# Patient Record
Sex: Female | Born: 1990
Health system: Southern US, Community
[De-identification: ages and names within clinical notes are randomized; demographics above are authoritative.]

## PROBLEM LIST (undated history)

## (undated) DIAGNOSIS — E7800 Pure hypercholesterolemia, unspecified: Secondary | ICD-10-CM

## (undated) DIAGNOSIS — E282 Polycystic ovarian syndrome: Secondary | ICD-10-CM

## (undated) DIAGNOSIS — R51 Headache: Secondary | ICD-10-CM

## (undated) DIAGNOSIS — R519 Headache, unspecified: Secondary | ICD-10-CM

## (undated) DIAGNOSIS — B009 Herpesviral infection, unspecified: Secondary | ICD-10-CM

## (undated) DIAGNOSIS — F419 Anxiety disorder, unspecified: Secondary | ICD-10-CM

## (undated) DIAGNOSIS — E559 Vitamin D deficiency, unspecified: Secondary | ICD-10-CM

## (undated) DIAGNOSIS — N390 Urinary tract infection, site not specified: Secondary | ICD-10-CM

## (undated) DIAGNOSIS — U071 COVID-19: Secondary | ICD-10-CM

## (undated) DIAGNOSIS — E782 Mixed hyperlipidemia: Secondary | ICD-10-CM

## (undated) DIAGNOSIS — B019 Varicella without complication: Secondary | ICD-10-CM

## (undated) DIAGNOSIS — K589 Irritable bowel syndrome without diarrhea: Secondary | ICD-10-CM

## (undated) DIAGNOSIS — G8929 Other chronic pain: Secondary | ICD-10-CM

## (undated) HISTORY — PX: WISDOM TOOTH EXTRACTION: SHX21

## (undated) HISTORY — PX: TONSILLECTOMY: SUR1361

## (undated) HISTORY — DX: Headache, unspecified: R51.9

## (undated) HISTORY — DX: Anxiety disorder, unspecified: F41.9

## (undated) HISTORY — DX: Urinary tract infection, site not specified: N39.0

## (undated) HISTORY — DX: Other chronic pain: G89.29

## (undated) HISTORY — DX: Headache: R51

## (undated) HISTORY — DX: Varicella without complication: B01.9

## (undated) HISTORY — DX: COVID-19: U07.1

## (undated) HISTORY — DX: Herpesviral infection, unspecified: B00.9

## (undated) HISTORY — PX: OTHER SURGICAL HISTORY: SHX169

## (undated) HISTORY — DX: Irritable bowel syndrome, unspecified: K58.9

## (undated) HISTORY — DX: Hemochromatosis, unspecified: E83.119

---
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
## 2014-05-26 LAB — HM PAP SMEAR: HM Pap smear: NORMAL

## 2017-06-03 ENCOUNTER — Ambulatory Visit: Payer: BLUE CROSS/BLUE SHIELD | Admitting: Internal Medicine

## 2017-06-03 ENCOUNTER — Encounter: Payer: Self-pay | Admitting: Internal Medicine

## 2017-06-03 VITALS — BP 114/78 | HR 93 | Temp 98.8°F | Resp 16 | Ht 64.75 in | Wt 169.4 lb

## 2017-06-03 DIAGNOSIS — N926 Irregular menstruation, unspecified: Secondary | ICD-10-CM

## 2017-06-03 DIAGNOSIS — Z1159 Encounter for screening for other viral diseases: Secondary | ICD-10-CM | POA: Diagnosis not present

## 2017-06-03 DIAGNOSIS — Z148 Genetic carrier of other disease: Secondary | ICD-10-CM | POA: Diagnosis not present

## 2017-06-03 DIAGNOSIS — R1030 Lower abdominal pain, unspecified: Secondary | ICD-10-CM

## 2017-06-03 DIAGNOSIS — K582 Mixed irritable bowel syndrome: Secondary | ICD-10-CM | POA: Diagnosis not present

## 2017-06-03 DIAGNOSIS — Z13220 Encounter for screening for lipoid disorders: Secondary | ICD-10-CM | POA: Diagnosis not present

## 2017-06-03 DIAGNOSIS — K5900 Constipation, unspecified: Secondary | ICD-10-CM | POA: Diagnosis not present

## 2017-06-03 DIAGNOSIS — F419 Anxiety disorder, unspecified: Secondary | ICD-10-CM

## 2017-06-03 DIAGNOSIS — G4700 Insomnia, unspecified: Secondary | ICD-10-CM

## 2017-06-03 DIAGNOSIS — Z1329 Encounter for screening for other suspected endocrine disorder: Secondary | ICD-10-CM

## 2017-06-03 DIAGNOSIS — M25559 Pain in unspecified hip: Secondary | ICD-10-CM | POA: Diagnosis not present

## 2017-06-03 LAB — URINALYSIS, ROUTINE W REFLEX MICROSCOPIC
Bilirubin Urine: NEGATIVE
Ketones, ur: NEGATIVE
Leukocytes, UA: NEGATIVE
Nitrite: NEGATIVE
Specific Gravity, Urine: >=1.03 — AB (ref 1.000–1.030)
Total Protein, Urine: NEGATIVE
Urine Glucose: NEGATIVE
Urobilinogen, UA: 0.2 (ref 0.0–1.0)
pH: 6 (ref 5.0–8.0)

## 2017-06-03 LAB — LIPID PANEL
Cholesterol: 193 mg/dL (ref 0–200)
HDL: 52.3 mg/dL (ref >39.00)
LDL Cholesterol: 110 mg/dL — ABNORMAL HIGH (ref 0–99)
NonHDL: 140.81
Total CHOL/HDL Ratio: 4
Triglycerides: 152 mg/dL — ABNORMAL HIGH (ref 0.0–149.0)
VLDL: 30.4 mg/dL (ref 0.0–40.0)

## 2017-06-03 LAB — CBC WITH DIFFERENTIAL/PLATELET
Basophils Absolute: 0.1 10*3/uL (ref 0.0–0.1)
Basophils Relative: 0.9 % (ref 0.0–3.0)
Eosinophils Absolute: 0.2 10*3/uL (ref 0.0–0.7)
Eosinophils Relative: 2.1 % (ref 0.0–5.0)
HCT: 38.3 % (ref 36.0–46.0)
Hemoglobin: 13.1 g/dL (ref 12.0–15.0)
Lymphocytes Relative: 27.7 % (ref 12.0–46.0)
Lymphs Abs: 2.3 10*3/uL (ref 0.7–4.0)
MCHC: 34.2 g/dL (ref 30.0–36.0)
MCV: 89.1 fl (ref 78.0–100.0)
Monocytes Absolute: 0.4 10*3/uL (ref 0.1–1.0)
Monocytes Relative: 4.9 % (ref 3.0–12.0)
Neutro Abs: 5.2 10*3/uL (ref 1.4–7.7)
Neutrophils Relative %: 64.4 % (ref 43.0–77.0)
Platelets: 324 10*3/uL (ref 150.0–400.0)
RBC: 4.29 Mil/uL (ref 3.87–5.11)
RDW: 12.2 % (ref 11.5–15.5)
WBC: 8.1 10*3/uL (ref 4.0–10.5)

## 2017-06-03 LAB — COMPREHENSIVE METABOLIC PANEL
ALT: 10 U/L (ref 0–35)
AST: 12 U/L (ref 0–37)
BUN: 8 mg/dL (ref 6–23)
CO2: 25 mEq/L (ref 19–32)
Calcium: 8.9 mg/dL (ref 8.4–10.5)
Chloride: 105 mEq/L (ref 96–112)
Creatinine, Ser: 0.72 mg/dL (ref 0.40–1.20)
Glucose, Bld: 97 mg/dL (ref 70–99)
Sodium: 138 mEq/L (ref 135–145)
Total Bilirubin: 0.5 mg/dL (ref 0.2–1.2)

## 2017-06-03 LAB — COMPREHENSIVE METABOLIC PANEL WITH GFR
Albumin: 3.7 g/dL (ref 3.5–5.2)
Alkaline Phosphatase: 48 U/L (ref 39–117)
GFR: 104.47 mL/min (ref >60.00)
Potassium: 3.7 meq/L (ref 3.5–5.1)
Total Protein: 6.7 g/dL (ref 6.0–8.3)

## 2017-06-03 LAB — TSH: TSH: 1.7 u[IU]/mL (ref 0.35–4.50)

## 2017-06-03 LAB — T4, FREE: Free T4: 0.79 ng/dL (ref 0.60–1.60)

## 2017-06-03 MED ORDER — DICYCLOMINE HCL 20 MG PO TABS: 20.0000 mg | ORAL_TABLET | Freq: Three times a day (TID) | ORAL | 0 refills | Status: DC

## 2017-06-03 MED ORDER — LORAZEPAM 0.5 MG PO TABS: 0.5000 mg | ORAL_TABLET | Freq: Every day | ORAL | 0 refills | Status: DC | PRN

## 2017-06-03 NOTE — Patient Instructions (Addendum)
Follow up in 1 month  Referred to GI  Read below  Take care  Try Bentyl for abdominal pain  Try Align probiotics from [employer] daily    Food Choices for Gastroesophageal Reflux Disease, Adult When you have gastroesophageal reflux disease (GERD), the foods you eat and your eating habits are very important. Choosing the right foods can help ease your discomfort. What guidelines do I need to follow?  Choose fruits, vegetables, whole grains, and low-fat dairy products.  Choose low-fat meat, fish, and poultry.  Limit fats such as oils, salad dressings, butter, nuts, and avocado.  Keep a food diary. This helps you identify foods that cause symptoms.  Avoid foods that cause symptoms. These may be different for everyone.  Eat small meals often instead of 3 large meals a day.  Eat your meals slowly, in a place where you are relaxed.  Limit fried foods.  Cook foods using methods other than frying.  Avoid drinking alcohol.  Avoid drinking large amounts of liquids with your meals.  Avoid bending over or lying down until 2-3 hours after eating. What foods are not recommended? These are some foods and drinks that may make your symptoms worse: Vegetables Tomatoes. Tomato juice. Tomato and spaghetti sauce. Chili peppers. Onion and garlic. Horseradish. Fruits Oranges, grapefruit, and lemon (fruit and juice). Meats High-fat meats, fish, and poultry. This includes hot dogs, ribs, ham, sausage, salami, and bacon. Dairy Whole milk and chocolate milk. Sour cream. Cream. Butter. Ice cream. Cream cheese. Drinks Coffee and tea. Bubbly (carbonated) drinks or energy drinks. Condiments Hot sauce. Barbecue sauce. Sweets/Desserts Chocolate and cocoa. Donuts. Peppermint and spearmint. Fats and Oils High-fat foods. This includes [place] fries and potato chips. Other Vinegar. Strong spices. This includes black pepper, white pepper, red pepper, cayenne, curry powder, cloves, ginger, and chili  powder. The items listed above may not be a complete list of foods and drinks to avoid. Contact your dietitian for more information. This information is not intended to replace advice given to you by your health care provider. Make sure you discuss any questions you have with your health care provider. Document Released: 10/26/2011 Document Revised: 10/02/2015 Document Reviewed: 02/28/2013 Elsevier Interactive Patient Education  2017 Elsevier Inc.   Gastroesophageal Reflux Disease, Adult Normally, food travels down the esophagus and stays in the stomach to be digested. If a person has gastroesophageal reflux disease (GERD), food and stomach acid move back up into the esophagus. When this happens, the esophagus becomes sore and swollen (inflamed). Over time, GERD can make small holes (ulcers) in the lining of the esophagus. Follow these instructions at home: Diet  Follow a diet as told by your doctor. You may need to avoid foods and drinks such as: ? Coffee and tea (with or without caffeine). ? Drinks that contain alcohol. ? Energy drinks and sports drinks. ? Carbonated drinks or sodas. ? Chocolate and cocoa. ? Peppermint and mint flavorings. ? Garlic and onions. ? Horseradish. ? Spicy and acidic foods, such as peppers, chili powder, curry powder, vinegar, hot sauces, and BBQ sauce. ? Citrus fruit juices and citrus fruits, such as oranges, lemons, and limes. ? Tomato-based foods, such as red sauce, chili, salsa, and pizza with red sauce. ? Fried and fatty foods, such as donuts, french fries, potato chips, and high-fat dressings. ? High-fat meats, such as hot dogs, rib eye steak, sausage, ham, and bacon. ? High-fat dairy items, such as whole milk, butter, and cream cheese.  Eat small meals often. Avoid eating  large meals.  Avoid drinking large amounts of liquid with your meals.  Avoid eating meals during the 2-3 hours before bedtime.  Avoid lying down right after you eat.  Do not  exercise right after you eat. General instructions  Pay attention to any changes in your symptoms.  Take over-the-counter and prescription medicines only as told by your doctor. Do not take aspirin, ibuprofen, or other NSAIDs unless your doctor says it is okay.  Do not use any tobacco products, including cigarettes, chewing tobacco, and e-cigarettes. If you need help quitting, ask your doctor.  Wear loose clothes. Do not wear anything tight around your waist.  Raise (elevate) the head of your bed about 6 inches (15 cm).  Try to lower your stress. If you need help doing this, ask your doctor.  If you are overweight, lose an amount of weight that is healthy for you. Ask your doctor about a safe weight loss goal.  Keep all follow-up visits as told by your doctor. This is important. Contact a doctor if:  You have new symptoms.  You lose weight and you do not know why it is happening.  You have trouble swallowing, or it hurts to swallow.  You have wheezing or a cough that keeps happening.  Your symptoms do not get better with treatment.  You have a hoarse voice. Get help right away if:  You have pain in your arms, neck, jaw, teeth, or back.  You feel sweaty, dizzy, or light-headed.  You have chest pain or shortness of breath.  You throw up (vomit) and your throw up looks like blood or coffee grounds.  You pass out (faint).  Your poop (stool) is bloody or black.  You cannot swallow, drink, or eat. This information is not intended to replace advice given to you by your health care provider. Make sure you discuss any questions you have with your health care provider. Document Released: 10/13/2007 Document Revised: 10/02/2015 Document Reviewed: 08/21/2014 Elsevier Interactive Patient Education  2018 Elsevier Inc.   Generalized Anxiety Disorder, Adult Generalized anxiety disorder (GAD) is a mental health disorder. People with this condition constantly worry about everyday  events. Unlike normal anxiety, worry related to GAD is not triggered by a specific event. These worries also do not fade or get better with time. GAD interferes with life functions, including relationships, work, and school. GAD can vary from mild to severe. People with severe GAD can have intense waves of anxiety with physical symptoms (panic attacks). What are the causes? The exact cause of GAD is not known. What increases the risk? This condition is more likely to develop in:  Women.  People who have a family history of anxiety disorders.  People who are very shy.  People who experience very stressful life events, such as the death of a loved one.  People who have a very stressful family environment.  What are the signs or symptoms? People with GAD often worry excessively about many things in their lives, such as their health and family. They may also be overly concerned about:  Doing well at work.  Being on time.  Natural disasters.  Friendships.  Physical symptoms of GAD include:  Fatigue.  Muscle tension or having muscle twitches.  Trembling or feeling shaky.  Being easily startled.  Feeling like your heart is pounding or racing.  Feeling out of breath or like you cannot take a deep breath.  Having trouble falling asleep or staying asleep.  Sweating.  Nausea, diarrhea,  or irritable bowel syndrome (IBS).  Headaches.  Trouble concentrating or remembering facts.  Restlessness.  Irritability.  How is this diagnosed? Your health care provider can diagnose GAD based on your symptoms and medical history. You will also have a physical exam. The health care provider will ask specific questions about your symptoms, including how severe they are, when they started, and if they come and go. Your health care provider may ask you about your use of alcohol or drugs, including prescription medicines. Your health care provider may refer you to a mental health specialist  for further evaluation. Your health care provider will do a thorough examination and may perform additional tests to rule out other possible causes of your symptoms. To be diagnosed with GAD, a person must have anxiety that:  Is out of his or her control.  Affects several different aspects of his or her life, such as work and relationships.  Causes distress that makes him or her unable to take part in normal activities.  Includes at least three physical symptoms of GAD, such as restlessness, fatigue, trouble concentrating, irritability, muscle tension, or sleep problems.  Before your health care provider can confirm a diagnosis of GAD, these symptoms must be present more days than they are not, and they must last for six months or longer. How is this treated? The following therapies are usually used to treat GAD:  Medicine. Antidepressant medicine is usually prescribed for long-term daily control. Antianxiety medicines may be added in severe cases, especially when panic attacks occur.  Talk therapy (psychotherapy). Certain types of talk therapy can be helpful in treating GAD by providing support, education, and guidance. Options include: ? Cognitive behavioral therapy (CBT). People learn coping skills and techniques to ease their anxiety. They learn to identify unrealistic or negative thoughts and behaviors and to replace them with positive ones. ? Acceptance and commitment therapy (ACT). This treatment teaches people how to be mindful as a way to cope with unwanted thoughts and feelings. ? Biofeedback. This process trains you to manage your body's response (physiological response) through breathing techniques and relaxation methods. You will work with a therapist while machines are used to monitor your physical symptoms.  Stress management techniques. These include yoga, meditation, and exercise.  A mental health specialist can help determine which treatment is best for you. Some people see  improvement with one type of therapy. However, other people require a combination of therapies. Follow these instructions at home:  Take over-the-counter and prescription medicines only as told by your health care provider.  Try to maintain a normal routine.  Try to anticipate stressful situations and allow extra time to manage them.  Practice any stress management or self-calming techniques as taught by your health care provider.  Do not punish yourself for setbacks or for not making progress.  Try to recognize your accomplishments, even if they are small.  Keep all follow-up visits as told by your health care provider. This is important. Contact a health care provider if:  Your symptoms do not get better.  Your symptoms get worse.  You have signs of depression, such as: ? A persistently sad, cranky, or irritable mood. ? Loss of enjoyment in activities that used to bring you joy. ? Change in weight or eating. ? Changes in sleeping habits. ? Avoiding friends or family members. ? Loss of energy for normal tasks. ? Feelings of guilt or worthlessness. Get help right away if:  You have serious thoughts about hurting yourself or  others. If you ever feel like you may hurt yourself or others, or have thoughts about taking your own life, get help right away. You can go to your nearest emergency department or call:  Your local emergency services (911 in the [place]  A suicide crisis helpline, such as the National Suicide Prevention Lifeline at 938-811-17381-681-688-2545. This is open 24 hours a day.  Summary  Generalized anxiety disorder (GAD) is a mental health disorder that involves worry that is not triggered by a specific event.  People with GAD often worry excessively about many things in their lives, such as their health and family.  GAD may cause physical symptoms such as restlessness, trouble concentrating, sleep problems, frequent sweating, nausea, diarrhea, headaches, and trembling  or muscle twitching.  A mental health specialist can help determine which treatment is best for you. Some people see improvement with one type of therapy. However, other people require a combination of therapies. This information is not intended to replace advice given to you by your health care provider. Make sure you discuss any questions you have with your health care provider. Document Released: 08/21/2012 Document Revised: 03/16/2016 Document Reviewed: 03/16/2016 Elsevier Interactive Patient Education  2018 Elsevier Inc.  Irritable Bowel Syndrome, Adult Irritable bowel syndrome (IBS) is not one specific disease. It is a group of symptoms that affects the organs responsible for digestion (gastrointestinal or GI tract). To regulate how your GI tract works, your body sends signals back and forth between your intestines and your brain. If you have IBS, there may be a problem with these signals. As a result, your GI tract does not function normally. Your intestines may become more sensitive and overreact to certain things. This is especially true when you eat certain foods or when you are under stress. There are four types of IBS. These may be determined based on the consistency of your stool:  IBS with diarrhea.  IBS with constipation.  Mixed IBS.  Unsubtyped IBS.  It is important to know which type of IBS you have. Some treatments are more likely to be helpful for certain types of IBS. What are the causes? The exact cause of IBS is not known. What increases the risk? You may have a higher risk of IBS if:  You are a woman.  You are younger than 26 years old.  You have a family history of IBS.  You have mental health problems.  You have had bacterial infection of your GI tract.  What are the signs or symptoms? Symptoms of IBS vary from person to person. The main symptom is abdominal pain or discomfort. Additional symptoms usually include one or more of the  following:  Diarrhea, constipation, or both.  Abdominal swelling or bloating.  Feeling full or sick after eating a small or regular-size meal.  Frequent gas.  Mucus in the stool.  A feeling of having more stool left after a bowel movement.  Symptoms tend to come and go. They may be associated with stress, psychiatric conditions, or nothing at all. How is this diagnosed? There is no specific test to diagnose IBS. Your health care provider will make a diagnosis based on a physical exam, medical history, and your symptoms. You may have other tests to rule out other conditions that may be causing your symptoms. These may include:  Blood tests.  X-rays.  CT scan.  Endoscopy and colonoscopy. This is a test in which your GI tract is viewed with a long, thin, flexible tube.  How is  this treated? There is no cure for IBS, but treatment can help relieve symptoms. IBS treatment often includes:  Changes to your diet, such as: ? Eating more fiber. ? Avoiding foods that cause symptoms. ? Drinking more water. ? Eating regular, medium-sized portioned meals.  Medicines. These may include: ? Fiber supplements if you have constipation. ? Medicine to control diarrhea (antidiarrheal medicines). ? Medicine to help control muscle spasms in your GI tract (antispasmodic medicines). ? Medicines to help with any mental health issues, such as antidepressants or tranquilizers.  Therapy. ? Talk therapy may help with anxiety, depression, or other mental health issues that can make IBS symptoms worse.  Stress reduction. ? Managing your stress can help keep symptoms under control.  Follow these instructions at home:  Take medicines only as directed by your health care provider.  Eat a healthy diet. ? Avoid foods and drinks with added sugar. ? Include more whole grains, fruits, and vegetables gradually into your diet. This may be especially helpful if you have IBS with constipation. ? Avoid any  foods and drinks that make your symptoms worse. These may include dairy products and caffeinated or carbonated drinks. ? Do not eat large meals. ? Drink enough fluid to keep your urine clear or pale yellow.  Exercise regularly. Ask your health care provider for recommendations of good activities for you.  Keep all follow-up visits as directed by your health care provider. This is important. Contact a health care provider if:  You have constant pain.  You have trouble or pain with swallowing.  You have worsening diarrhea. Get help right away if:  You have severe and worsening abdominal pain.  You have diarrhea and: ? You have a rash, stiff neck, or severe headache. ? You are irritable, sleepy, or difficult to awaken. ? You are weak, dizzy, or extremely thirsty.  You have bright red blood in your stool or you have black tarry stools.  You have unusual abdominal swelling that is painful.  You vomit continuously.  You vomit blood (hematemesis).  You have both abdominal pain and a fever. This information is not intended to replace advice given to you by your health care provider. Make sure you discuss any questions you have with your health care provider. Document Released: 04/26/2005 Document Revised: 09/26/2015 Document Reviewed: 01/11/2014 Elsevier Interactive Patient Education  2018 [employer].  Diet for Irritable Bowel Syndrome When you have irritable bowel syndrome (IBS), the foods you eat and your eating habits are very important. IBS may cause various symptoms, such as abdominal pain, constipation, or diarrhea. Choosing the right foods can help ease discomfort caused by these symptoms. Work with your health care provider and dietitian to find the best eating plan to help control your symptoms. What general guidelines do I need to follow?  Keep a food diary. This will help you identify foods that cause symptoms. Write down: ? What you eat and when. ? What symptoms you  have. ? When symptoms occur in relation to your meals.  Avoid foods that cause symptoms. Talk with your dietitian about other ways to get the same nutrients that are in these foods.  Eat more foods that contain fiber. Take a fiber supplement if directed by your dietitian.  Eat your meals slowly, in a relaxed setting.  Aim to eat 5-6 small meals per day. Do not skip meals.  Drink enough fluids to keep your urine clear or pale yellow.  Ask your health care provider if you should take  an over-the-counter probiotic during flare-ups to help restore healthy gut bacteria.  If you have cramping or diarrhea, try making your meals low in fat and high in carbohydrates. Examples of carbohydrates are pasta, rice, whole grain breads and cereals, fruits, and vegetables.  If dairy products cause your symptoms to flare up, try eating less of them. You might be able to handle yogurt better than other dairy products because it contains bacteria that help with digestion. What foods are not recommended? The following are some foods and drinks that may worsen your symptoms:  Fatty foods, such as [place] fries.  Milk products, such as cheese or ice cream.  Chocolate.  Alcohol.  Products with caffeine, such as coffee.  Carbonated drinks, such as soda.  The items listed above may not be a complete list of foods and beverages to avoid. Contact your dietitian for more information. What foods are good sources of fiber? Your health care provider or dietitian may recommend that you eat more foods that contain fiber. Fiber can help reduce constipation and other IBS symptoms. Add foods with fiber to your diet a little at a time so that your body can get used to them. Too much fiber at once might cause gas and swelling of your abdomen. The following are some foods that are good sources of fiber:  Apples.  Peaches.  Pears.  Berries.  Figs.  Broccoli (raw).  Cabbage.  Carrots.  Raw peas.  Kidney  beans.  Lima beans.  Whole grain bread.  Whole grain cereal.  Where to find more information: [employer] for Functional Gastrointestinal Disorders: www.iffgd.[employer] of Diabetes and Digestive and Kidney Diseases: http://norris-lawson.com/.aspx This information is not intended to replace advice given to you by your health care provider. Make sure you discuss any questions you have with your health care provider. Document Released: 07/17/2003 Document Revised: 10/02/2015 Document Reviewed: 07/27/2013 Elsevier Interactive Patient Education  2018 [employer].

## 2017-06-05 LAB — TRANSFERRIN SATURATION
IRON SATN MFR SERPL: 39 %{saturation}
IRON SERPL-MCNC: 186 ug/dL — ABNORMAL HIGH
TRANSFERRIN SERPL-MCNC: 340 mg/dL

## 2017-06-07 ENCOUNTER — Encounter: Payer: Self-pay | Admitting: Internal Medicine

## 2017-06-07 DIAGNOSIS — R1030 Lower abdominal pain, unspecified: Secondary | ICD-10-CM | POA: Insufficient documentation

## 2017-06-07 DIAGNOSIS — R197 Diarrhea, unspecified: Secondary | ICD-10-CM | POA: Insufficient documentation

## 2017-06-07 DIAGNOSIS — Z148 Genetic carrier of other disease: Secondary | ICD-10-CM | POA: Insufficient documentation

## 2017-06-07 DIAGNOSIS — N926 Irregular menstruation, unspecified: Secondary | ICD-10-CM | POA: Insufficient documentation

## 2017-06-07 DIAGNOSIS — G4700 Insomnia, unspecified: Secondary | ICD-10-CM | POA: Insufficient documentation

## 2017-06-07 DIAGNOSIS — K582 Mixed irritable bowel syndrome: Secondary | ICD-10-CM | POA: Insufficient documentation

## 2017-06-07 DIAGNOSIS — K5900 Constipation, unspecified: Secondary | ICD-10-CM | POA: Insufficient documentation

## 2017-06-07 DIAGNOSIS — F419 Anxiety disorder, unspecified: Secondary | ICD-10-CM | POA: Insufficient documentation
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
## 2017-06-09 LAB — TISSUE TRANSGLUTAMINASE, IGA: (tTG) Ab, IgA: 1 U/mL

## 2017-06-09 LAB — RETICULIN ANTIBODIES, IGA W TITER: Reticulin IGA Screen: NEGATIVE

## 2017-06-09 LAB — IRON,TIBC AND FERRITIN PANEL
%SAT: 47 % (calc) (ref 11–50)
Ferritin: 47 ng/mL (ref 10–154)
Iron: 190 ug/dL (ref 40–190)
TIBC: 406 ug/dL (ref 250–450)

## 2017-06-09 LAB — HEPATITIS B SURFACE ANTIBODY, QUANTITATIVE: Hep B S AB Quant (Post): <5 m[IU]/mL — ABNORMAL LOW (ref >=10)

## 2017-06-09 LAB — GLIADIN ANTIBODIES, SERUM
Gliadin IgA: 4 Units
Gliadin IgG: 2 U

## 2017-06-10 ENCOUNTER — Ambulatory Visit: Payer: BLUE CROSS/BLUE SHIELD | Admitting: Gastroenterology

## 2017-06-10 ENCOUNTER — Encounter: Payer: Self-pay | Admitting: Gastroenterology

## 2017-06-10 ENCOUNTER — Other Ambulatory Visit: Payer: Self-pay

## 2017-06-10 VITALS — BP 118/81 | HR 88 | Temp 98.1°F | Ht 63.0 in | Wt 168.8 lb

## 2017-06-10 DIAGNOSIS — K580 Irritable bowel syndrome with diarrhea: Secondary | ICD-10-CM | POA: Diagnosis not present

## 2017-06-10 DIAGNOSIS — G43909 Migraine, unspecified, not intractable, without status migrainosus: Secondary | ICD-10-CM | POA: Insufficient documentation

## 2017-06-10 MED ORDER — AMITRIPTYLINE HCL 25 MG PO TABS: 25.0000 mg | ORAL_TABLET | Freq: Every day | ORAL | 0 refills | Status: DC
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
## 2017-06-11 LAB — SEDIMENTATION RATE: Sed Rate: 5 mm/hr (ref 0–32)

## 2017-06-11 LAB — C-REACTIVE PROTEIN: CRP: 8 mg/L — ABNORMAL HIGH (ref 0.0–4.9)

## 2017-06-13 ENCOUNTER — Encounter: Payer: Self-pay | Admitting: Gastroenterology

## 2017-06-16 ENCOUNTER — Telehealth: Payer: Self-pay | Admitting: Internal Medicine

## 2017-06-16 NOTE — Telephone Encounter (Signed)
Labs have not been reviewed by provider.

## 2017-06-16 NOTE — Telephone Encounter (Signed)
Patient is calling to review her lab results. She states she did not get a call. Reviewed results with patient and she voices understanding. She will discuss stating her Hep B vaccine at her follow up appointment.

## 2017-06-20 LAB — GI PROFILE, STOOL, PCR

## 2017-06-20 LAB — C DIFFICILE, CYTOTOXIN B

## 2017-06-20 LAB — SPECIMEN STATUS REPORT

## 2017-06-20 LAB — CALPROTECTIN, FECAL: Calprotectin, Fecal: <16 ug/g (ref 0–120)

## 2017-06-20 LAB — C DIFFICILE TOXINS A+B W/RFLX: C difficile Toxins A+B, EIA: NEGATIVE

## 2017-06-21 ENCOUNTER — Other Ambulatory Visit: Payer: Self-pay

## 2017-06-21 DIAGNOSIS — R197 Diarrhea, unspecified: Secondary | ICD-10-CM

## 2017-06-21 MED ORDER — NA SULFATE-K SULFATE-MG SULF 17.5-3.13-1.6 GM/177ML PO SOLN: ORAL | 0 refills | Status: DC

## 2017-07-01 ENCOUNTER — Encounter: Payer: Self-pay | Admitting: Internal Medicine

## 2017-07-01 ENCOUNTER — Other Ambulatory Visit: Payer: Self-pay | Admitting: Internal Medicine

## 2017-07-01 ENCOUNTER — Ambulatory Visit: Payer: BLUE CROSS/BLUE SHIELD | Admitting: Internal Medicine

## 2017-07-01 VITALS — BP 110/60 | HR 100 | Temp 99.0°F | Ht 63.0 in | Wt 174.8 lb

## 2017-07-01 DIAGNOSIS — F419 Anxiety disorder, unspecified: Secondary | ICD-10-CM

## 2017-07-01 DIAGNOSIS — N888 Other specified noninflammatory disorders of cervix uteri: Secondary | ICD-10-CM | POA: Diagnosis not present

## 2017-07-01 DIAGNOSIS — K582 Mixed irritable bowel syndrome: Secondary | ICD-10-CM | POA: Diagnosis not present

## 2017-07-01 DIAGNOSIS — N941 Unspecified dyspareunia: Secondary | ICD-10-CM | POA: Diagnosis not present

## 2017-07-01 DIAGNOSIS — R197 Diarrhea, unspecified: Secondary | ICD-10-CM | POA: Diagnosis not present

## 2017-07-01 NOTE — Progress Notes (Signed)
Pre visit review using our clinic review tool, if applicable. No additional management support is needed unless otherwise documented below in the visit note. 
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
## 2017-07-01 NOTE — Patient Instructions (Signed)
Asked GI about reducing dose amitriptyline to 10 Ask about allergy testing  Ask about other alternatives to IBguard  Come back for nurse visit for hepatitis B vaccines at 0, 2 and 6 months  F/u in 4 months  [facility] and make f/u appt with Dr. Dalbert GarnetBeasley  Take care   Irritable Bowel Syndrome, Adult Irritable bowel syndrome (IBS) is not one specific disease. It is a group of symptoms that affects the organs responsible for digestion (gastrointestinal or GI tract). To regulate how your GI tract works, your body sends signals back and forth between your intestines and your brain. If you have IBS, there may be a problem with these signals. As a result, your GI tract does not function normally. Your intestines may become more sensitive and overreact to certain things. This is especially true when you eat certain foods or when you are under stress. There are four types of IBS. These may be determined based on the consistency of your stool:  IBS with diarrhea.  IBS with constipation.  Mixed IBS.  Unsubtyped IBS.  It is important to know which type of IBS you have. Some treatments are more likely to be helpful for certain types of IBS. What are the causes? The exact cause of IBS is not known. What increases the risk? You may have a higher risk of IBS if:  You are a woman.  You are younger than 26 years old.  You have a family history of IBS.  You have mental health problems.  You have had bacterial infection of your GI tract.  What are the signs or symptoms? Symptoms of IBS vary from person to person. The main symptom is abdominal pain or discomfort. Additional symptoms usually include one or more of the following:  Diarrhea, constipation, or both.  Abdominal swelling or bloating.  Feeling full or sick after eating a small or regular-size meal.  Frequent gas.  Mucus in the stool.  A feeling of having more stool left after a bowel movement.  Symptoms tend to come and go.  They may be associated with stress, psychiatric conditions, or nothing at all. How is this diagnosed? There is no specific test to diagnose IBS. Your health care provider will make a diagnosis based on a physical exam, medical history, and your symptoms. You may have other tests to rule out other conditions that may be causing your symptoms. These may include:  Blood tests.  X-rays.  CT scan.  Endoscopy and colonoscopy. This is a test in which your GI tract is viewed with a long, thin, flexible tube.  How is this treated? There is no cure for IBS, but treatment can help relieve symptoms. IBS treatment often includes:  Changes to your diet, such as: ? Eating more fiber. ? Avoiding foods that cause symptoms. ? Drinking more water. ? Eating regular, medium-sized portioned meals.  Medicines. These may include: ? Fiber supplements if you have constipation. ? Medicine to control diarrhea (antidiarrheal medicines). ? Medicine to help control muscle spasms in your GI tract (antispasmodic medicines). ? Medicines to help with any mental health issues, such as antidepressants or tranquilizers.  Therapy. ? Talk therapy may help with anxiety, depression, or other mental health issues that can make IBS symptoms worse.  Stress reduction. ? Managing your stress can help keep symptoms under control.  Follow these instructions at home:  Take medicines only as directed by your health care provider.  Eat a healthy diet. ? Avoid foods and drinks  with added sugar. ? Include more whole grains, fruits, and vegetables gradually into your diet. This may be especially helpful if you have IBS with constipation. ? Avoid any foods and drinks that make your symptoms worse. These may include dairy products and caffeinated or carbonated drinks. ? Do not eat large meals. ? Drink enough fluid to keep your urine clear or pale yellow.  Exercise regularly. Ask your health care provider for recommendations of  good activities for you.  Keep all follow-up visits as directed by your health care provider. This is important. Contact a health care provider if:  You have constant pain.  You have trouble or pain with swallowing.  You have worsening diarrhea. Get help right away if:  You have severe and worsening abdominal pain.  You have diarrhea and: ? You have a rash, stiff neck, or severe headache. ? You are irritable, sleepy, or difficult to awaken. ? You are weak, dizzy, or extremely thirsty.  You have bright red blood in your stool or you have black tarry stools.  You have unusual abdominal swelling that is painful.  You vomit continuously.  You vomit blood (hematemesis).  You have both abdominal pain and a fever. This information is not intended to replace advice given to you by your health care provider. Make sure you discuss any questions you have with your health care provider. Document Released: 04/26/2005 Document Revised: 09/26/2015 Document Reviewed: 01/11/2014 Elsevier Interactive Patient Education  2018 [employer].

## 2017-07-04 ENCOUNTER — Ambulatory Visit: Payer: BLUE CROSS/BLUE SHIELD | Admitting: Anesthesiology

## 2017-07-04 ENCOUNTER — Ambulatory Visit
Admission: RE | Admit: 2017-07-04 | Discharge: 2017-07-04 | Disposition: A | Discharge status: Home / Self Care | Payer: BLUE CROSS/BLUE SHIELD | Source: Ambulatory Visit | Attending: Gastroenterology | Admitting: Gastroenterology

## 2017-07-04 ENCOUNTER — Encounter: Payer: Self-pay | Admitting: *Deleted

## 2017-07-04 ENCOUNTER — Encounter
Admission: RE | Disposition: A | Discharge status: Home / Self Care | Payer: Self-pay | Source: Ambulatory Visit | Attending: Gastroenterology

## 2017-07-04 DIAGNOSIS — K6389 Other specified diseases of intestine: Secondary | ICD-10-CM | POA: Insufficient documentation

## 2017-07-04 DIAGNOSIS — K259 Gastric ulcer, unspecified as acute or chronic, without hemorrhage or perforation: Secondary | ICD-10-CM | POA: Diagnosis not present

## 2017-07-04 DIAGNOSIS — Z87891 Personal history of nicotine dependence: Secondary | ICD-10-CM | POA: Diagnosis not present

## 2017-07-04 DIAGNOSIS — R7982 Elevated C-reactive protein (CRP): Secondary | ICD-10-CM | POA: Insufficient documentation

## 2017-07-04 DIAGNOSIS — K589 Irritable bowel syndrome without diarrhea: Secondary | ICD-10-CM | POA: Diagnosis not present

## 2017-07-04 DIAGNOSIS — K3189 Other diseases of stomach and duodenum: Secondary | ICD-10-CM

## 2017-07-04 DIAGNOSIS — F419 Anxiety disorder, unspecified: Secondary | ICD-10-CM | POA: Insufficient documentation

## 2017-07-04 DIAGNOSIS — K2960 Other gastritis without bleeding: Secondary | ICD-10-CM | POA: Insufficient documentation

## 2017-07-04 DIAGNOSIS — R197 Diarrhea, unspecified: Secondary | ICD-10-CM

## 2017-07-04 DIAGNOSIS — K529 Noninfective gastroenteritis and colitis, unspecified: Secondary | ICD-10-CM | POA: Diagnosis not present

## 2017-07-04 DIAGNOSIS — K2950 Unspecified chronic gastritis without bleeding: Secondary | ICD-10-CM | POA: Insufficient documentation

## 2017-07-04 DIAGNOSIS — I739 Peripheral vascular disease, unspecified: Secondary | ICD-10-CM | POA: Diagnosis not present

## 2017-07-04 DIAGNOSIS — Z793 Long term (current) use of hormonal contraceptives: Secondary | ICD-10-CM | POA: Insufficient documentation

## 2017-07-04 DIAGNOSIS — K644 Residual hemorrhoidal skin tags: Secondary | ICD-10-CM | POA: Diagnosis not present

## 2017-07-04 HISTORY — PX: COLONOSCOPY WITH PROPOFOL: SHX5780

## 2017-07-04 HISTORY — PX: ESOPHAGOGASTRODUODENOSCOPY (EGD) WITH PROPOFOL: SHX5813

## 2017-07-04 LAB — POCT PREGNANCY, URINE: Preg Test, Ur: NEGATIVE

## 2017-07-04 SURGERY — ESOPHAGOGASTRODUODENOSCOPY (EGD) WITH PROPOFOL
Anesthesia: General

## 2017-07-04 MED ORDER — SODIUM CHLORIDE 0.9 % IV SOLN
INTRAVENOUS | Status: DC
  Administered 2017-07-04 (×2): via INTRAVENOUS

## 2017-07-04 MED ORDER — PROPOFOL 500 MG/50ML IV EMUL
INTRAVENOUS | Status: DC | PRN
  Administered 2017-07-04: 125 ug/kg/min via INTRAVENOUS

## 2017-07-04 MED ORDER — PROPOFOL 10 MG/ML IV BOLUS
INTRAVENOUS | Status: DC | PRN
  Administered 2017-07-04 (×3): 50 mg via INTRAVENOUS

## 2017-07-04 MED ORDER — PROPOFOL 500 MG/50ML IV EMUL
INTRAVENOUS | Status: AC
  Filled 2017-07-04: qty 50
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
## 2017-07-04 NOTE — Transfer of Care (Signed)
Immediate Anesthesia Transfer of Care Note  Patient: Christina Acevedo  Procedure(s) Performed: ESOPHAGOGASTRODUODENOSCOPY (EGD) WITH PROPOFOL (N/A ) COLONOSCOPY WITH PROPOFOL (N/A )  Patient Location: PACU and Endoscopy Unit  Anesthesia Type:General  Level of Consciousness: awake and alert   Airway & Oxygen Therapy: Patient Spontanous Breathing and Patient connected to nasal cannula oxygen  Post-op Assessment: Report given to RN and Post -op Vital signs reviewed and stable  Post vital signs: Reviewed and stable  Last Vitals:  Vitals:   07/04/17 1036  BP: (!) 125/91  Pulse: 86  Resp: 16  Temp: (!) 36.4 C  SpO2: 100%    Last Pain:  Vitals:   07/04/17 1036  TempSrc: Tympanic         Complications: No apparent anesthesia complications

## 2017-07-04 NOTE — Anesthesia Preprocedure Evaluation (Signed)
Anesthesia Evaluation  Patient identified by MRN, date of birth, ID band Patient awake    Reviewed: Allergy & Precautions, H&P , NPO status , Patient's Chart, lab work & pertinent test results  History of Anesthesia Complications Negative for: history of anesthetic complications  Airway Mallampati: II  TM Distance: >3 FB Neck ROM: full    Dental  (+) Chipped, Poor Dentition   Pulmonary Current Smoker,           Cardiovascular Exercise Tolerance: Good (-) angina+ Peripheral Vascular Disease  (-) Past MI and (-) DOE      Neuro/Psych  Headaches, PSYCHIATRIC DISORDERS Anxiety    GI/Hepatic negative GI ROS, Neg liver ROS, neg GERD  ,  Endo/Other  negative endocrine ROS  Renal/GU negative Renal ROS  negative genitourinary   Musculoskeletal   Abdominal   Peds  Hematology negative hematology ROS (+)   Anesthesia Other Findings Past Medical History: No date: Chicken pox No date: Chronic headache     Comment:  migraines  No date: IBS (irritable bowel syndrome)     Comment:  mixed No date: UTI (urinary tract infection)  Past Surgical History: No date: TONSILLECTOMY     Comment:  2008/2009 No date: Wisdom Teeth Removal  BMI    Body Mass Index:  30.82 kg/m      Reproductive/Obstetrics negative OB ROS                             Anesthesia Physical Anesthesia Plan  ASA: III  Anesthesia Plan: General   Post-op Pain Management:    Induction: Intravenous  PONV Risk Score and Plan: Propofol infusion and TIVA  Airway Management Planned: Natural Airway and Nasal Cannula  Additional Equipment:   Intra-op Plan:   Post-operative Plan:   Informed Consent: I have reviewed the patients History and Physical, chart, labs and discussed the procedure including the risks, benefits and alternatives for the proposed anesthesia with the patient or authorized representative who has indicated  his/her understanding and acceptance.   Dental Advisory Given  Plan Discussed with: Anesthesiologist, CRNA and Surgeon  Anesthesia Plan Comments: (Patient consented for risks of anesthesia including but not limited to:  - adverse reactions to medications - risk of intubation if required - damage to teeth, lips or other oral mucosa - sore throat or hoarseness - Damage to heart, brain, lungs or loss of life  Patient voiced understanding.)        Anesthesia Quick Evaluation

## 2017-07-04 NOTE — Anesthesia Postprocedure Evaluation (Signed)
Anesthesia Post Note  Patient: Christina Acevedo  Procedure(s) Performed: ESOPHAGOGASTRODUODENOSCOPY (EGD) WITH PROPOFOL (N/A ) COLONOSCOPY WITH PROPOFOL (N/A )  Patient location during evaluation: Endoscopy Anesthesia Type: General Level of consciousness: awake and alert Pain management: pain level controlled Vital Signs Assessment: post-procedure vital signs reviewed and stable Respiratory status: spontaneous breathing, nonlabored ventilation, respiratory function stable and patient connected to nasal cannula oxygen Cardiovascular status: blood pressure returned to baseline and stable Postop Assessment: no apparent nausea or vomiting Anesthetic complications: no     Last Vitals:  Vitals:   07/04/17 1210 07/04/17 1220  BP: 119/81 117/85  Pulse: 80 77  Resp: 14   Temp:    SpO2:      Last Pain:  Vitals:   07/04/17 1150  TempSrc: Tympanic                 Cleda MccreedyJoseph K Piscitello

## 2017-07-04 NOTE — Anesthesia Post-op Follow-up Note (Signed)
Anesthesia QCDR form completed.        

## 2017-07-04 NOTE — Op Note (Signed)
[facility] Gastroenterology Patient Name: Christina Acevedo Procedure Date: 07/04/2017 11:06 AM MRN: [id number] Account #: [id number] Date of Birth: 24-Jun-1990 Admit Type: Outpatient Age: 27 Room: [facility] ENDO ROOM 2 Gender: Female Note Status: Finalized Procedure:            Upper GI endoscopy Indications:          Diarrhea Providers:            Lin Landsman MD, MD Referring MD:         No Local Md, MD (Referring MD) Medicines:            Monitored Anesthesia Care Complications:        No immediate complications. Estimated blood loss:                        Minimal. Procedure:            Pre-Anesthesia Assessment:                       - Prior to the procedure, a History and Physical was                        performed, and patient medications and allergies were                        reviewed. The patient is competent. The risks and                        benefits of the procedure and the sedation options and                        risks were discussed with the patient. All questions                        were answered and informed consent was obtained.                        Patient identification and proposed procedure were                        verified by the physician, the nurse, the                        anesthesiologist, the anesthetist and the technician in                        the pre-procedure area in the procedure room in the                        endoscopy suite. Mental Status Examination: alert and                        oriented. Airway Examination: normal oropharyngeal                        airway and neck mobility. Respiratory Examination:                        clear to auscultation. CV Examination: normal.  Prophylactic Antibiotics: The patient does not require                        prophylactic antibiotics. Prior Anticoagulants: The                        patient has taken no previous anticoagulant or              antiplatelet agents. ASA Grade Assessment: II - A                        patient with mild systemic disease. After reviewing the                        risks and benefits, the patient was deemed in                        satisfactory condition to undergo the procedure. The                        anesthesia plan was to use monitored anesthesia care                        (MAC). Immediately prior to administration of                        medications, the patient was re-assessed for adequacy                        to receive sedatives. The heart rate, respiratory rate,                        oxygen saturations, blood pressure, adequacy of                        pulmonary ventilation, and response to care were                        monitored throughout the procedure. The physical status                        of the patient was re-assessed after the procedure.                       After obtaining informed consent, the endoscope was                        passed under direct vision. Throughout the procedure,                        the patient's blood pressure, pulse, and oxygen                        saturations were monitored continuously. The Endoscope                        was introduced through the mouth, and advanced to the                        second part of duodenum. The upper GI endoscopy  was                        accomplished without difficulty. The patient tolerated                        the procedure well. Findings:      The duodenal bulb and first portion of the duodenum were normal.       Biopsies were taken with a cold forceps for histology.      Multiple dispersed, small non-bleeding erosions were found in the       gastric antrum. There were no stigmata of recent bleeding.      The cardia, gastric fundus, gastric body, incisura and pylorus were       normal. Biopsies were taken with a cold forceps for Helicobacter pylori       testing.      The  gastroesophageal junction and examined esophagus were normal. Impression:           - Normal duodenal bulb and first portion of the                        duodenum. Biopsied.                       - Non-bleeding erosive gastropathy.                       - Normal cardia, gastric fundus, gastric body, incisura                        and pylorus. Biopsied.                       - Normal gastroesophageal junction and esophagus. Recommendation:       - Await pathology results.                       - Proceed with colonoscopy as scheduled                       See colonoscopy report Procedure Code(s):    --- Professional ---                       [phone], Esophagogastroduodenoscopy, flexible, transoral;                        with biopsy, single or multiple Diagnosis Code(s):    --- Professional ---                       K31.89, Other diseases of stomach and duodenum                       R19.7, Diarrhea, unspecified CPT copyright 2016 American Medical Association. All rights reserved. The codes documented in this report are preliminary and upon coder review may  be revised to meet current compliance requirements. Dr. Ulyess Mort Lin Landsman MD, MD 07/04/2017 11:21:49 AM This report has been signed electronically. Number of Addenda: 0 Note Initiated On: 07/04/2017 11:06 AM      [facility]

## 2017-07-04 NOTE — Op Note (Signed)
[facility] Gastroenterology Patient Name: Christina Acevedo Procedure Date: 07/04/2017 11:06 AM MRN: [id number] Account #: [id number] Date of Birth: 1990/08/09 Admit Type: Outpatient Age: 27 Room: [facility] ENDO ROOM 2 Gender: Female Note Status: Finalized Procedure:            Colonoscopy Indications:          This is the patient's first colonoscopy, , Clinically                        significant diarrhea of unexplained origin, elevated CRP Providers:            Lin Landsman MD, MD Referring MD:         No Local Md, MD (Referring MD) Medicines:            Monitored Anesthesia Care Complications:        No immediate complications. Estimated blood loss: None. Procedure:            Pre-Anesthesia Assessment:                       - Prior to the procedure, a History and Physical was                        performed, and patient medications and allergies were                        reviewed. The patient is competent. The risks and                        benefits of the procedure and the sedation options and                        risks were discussed with the patient. All questions                        were answered and informed consent was obtained.                        Patient identification and proposed procedure were                        verified by the physician, the nurse, the                        anesthesiologist, the anesthetist and the technician in                        the pre-procedure area in the procedure room in the                        endoscopy suite. Mental Status Examination: alert and                        oriented. Airway Examination: normal oropharyngeal                        airway and neck mobility. Respiratory Examination:                        clear to auscultation. CV Examination: normal.  Prophylactic Antibiotics: The patient does not require                        prophylactic antibiotics. Prior  Anticoagulants: The                        patient has taken no previous anticoagulant or                        antiplatelet agents. ASA Grade Assessment: II - A                        patient with mild systemic disease. After reviewing the                        risks and benefits, the patient was deemed in                        satisfactory condition to undergo the procedure. The                        anesthesia plan was to use monitored anesthesia care                        (MAC). Immediately prior to administration of                        medications, the patient was re-assessed for adequacy                        to receive sedatives. The heart rate, respiratory rate,                        oxygen saturations, blood pressure, adequacy of                        pulmonary ventilation, and response to care were                        monitored throughout the procedure. The physical status                        of the patient was re-assessed after the procedure.                       After obtaining informed consent, the colonoscope was                        passed under direct vision. Throughout the procedure,                        the patient's blood pressure, pulse, and oxygen                        saturations were monitored continuously. The                        Colonoscope was introduced through the anus and  advanced to the 5 cm into the ileum. The colonoscopy                        was performed without difficulty. The patient tolerated                        the procedure well. The quality of the bowel                        preparation was evaluated using the BBPS [facility] Bowel                        Preparation Scale) with scores of: Right Colon = 3,                        Transverse Colon = 3 and Left Colon = 3 (entire mucosa                        seen well with no residual staining, small fragments of                        stool or opaque  liquid). The total BBPS score equals 9. Findings:      The terminal ileum appeared normal. Biopsies were taken with a cold       forceps for histology.      The colon (entire examined portion) appeared normal. Biopsies were taken       with a cold forceps for histology.      The retroflexed view of the distal rectum and anal verge was normal and       showed no anal or rectal abnormalities.      The perianal exam findings include non-thrombosed external hemorrhoids. Impression:           - The examined portion of the ileum was normal.                        Biopsied.                       - The entire examined colon is normal. Biopsied.                       - The distal rectum and anal verge are normal on                        retroflexion view.                       - Non-thrombosed external hemorrhoids found on perianal                        exam. Recommendation:       - Discharge patient to home.                       - Resume previous diet today.                       - Continue present medications.                       -  Await pathology results. Procedure Code(s):    --- Professional ---                       [phone], Colonoscopy, flexible; with biopsy, single or                        multiple Diagnosis Code(s):    --- Professional ---                       K64.4, Residual hemorrhoidal skin tags                       R19.7, Diarrhea, unspecified CPT copyright 2016 American Medical Association. All rights reserved. The codes documented in this report are preliminary and upon coder review may  be revised to meet current compliance requirements. Dr. Ulyess Mort Lin Landsman MD, MD 07/04/2017 11:36:46 AM This report has been signed electronically. Number of Addenda: 0 Note Initiated On: 07/04/2017 11:06 AM Scope Withdrawal Time: 0 hours 7 minutes 52 seconds  Total Procedure Duration: 0 hours 11 minutes 10 seconds       [facility]

## 2017-07-05 ENCOUNTER — Encounter: Payer: Self-pay | Admitting: Gastroenterology

## 2017-07-05 LAB — SURGICAL PATHOLOGY

## 2017-07-08 ENCOUNTER — Encounter: Payer: Self-pay | Admitting: Gastroenterology

## 2017-07-08 ENCOUNTER — Ambulatory Visit: Payer: BLUE CROSS/BLUE SHIELD | Admitting: Gastroenterology

## 2017-07-08 VITALS — BP 117/83 | HR 101 | Temp 98.2°F

## 2017-07-08 DIAGNOSIS — K582 Mixed irritable bowel syndrome: Secondary | ICD-10-CM | POA: Diagnosis not present

## 2017-07-08 MED ORDER — AMITRIPTYLINE HCL 25 MG PO TABS
25.0000 mg | ORAL_TABLET | Freq: Every day | ORAL | 3 refills | Status: DC
Start: 2017-07-08 — End: 2017-10-11

## 2017-07-08 MED ORDER — RIFAXIMIN 550 MG PO TABS: 550.0000 mg | ORAL_TABLET | Freq: Three times a day (TID) | ORAL | 3 refills | Status: DC

## 2017-07-08 MED ORDER — RIFAXIMIN 200 MG PO TABS: 200.0000 mg | ORAL_TABLET | Freq: Three times a day (TID) | ORAL | 3 refills | Status: DC
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
## 2017-07-08 NOTE — Addendum Note (Signed)
Addended by: Avie ArenasTEGA, Marthella Osorno S on: 07/08/2017 09:08 AM   Modules accepted: Orders

## 2017-07-12 ENCOUNTER — Other Ambulatory Visit: Payer: Self-pay | Admitting: Internal Medicine

## 2017-07-12 DIAGNOSIS — F419 Anxiety disorder, unspecified: Secondary | ICD-10-CM

## 2017-07-12 NOTE — Telephone Encounter (Signed)
Refilled: 06/03/2017 Last OV: 07/01/2017 Next OV: 10/31/2017

## 2017-07-13 MED ORDER — LORAZEPAM 0.5 MG PO TABS: 0.5000 mg | ORAL_TABLET | Freq: Every day | ORAL | 0 refills | Status: DC | PRN

## 2017-07-14 ENCOUNTER — Ambulatory Visit: Payer: BLUE CROSS/BLUE SHIELD | Admitting: Family Medicine

## 2017-07-14 ENCOUNTER — Encounter: Payer: Self-pay | Admitting: Family Medicine

## 2017-07-14 VITALS — BP 122/82 | HR 98 | Temp 98.9°F | Resp 15 | Wt 175.2 lb

## 2017-07-14 DIAGNOSIS — J069 Acute upper respiratory infection, unspecified: Secondary | ICD-10-CM

## 2017-07-14 MED ORDER — FLUTICASONE PROPIONATE 50 MCG/ACT NA SUSP: 2.0000 | Freq: Every day | NASAL | 6 refills | Status: DC

## 2017-07-14 NOTE — Progress Notes (Signed)
Patient ID: Christina Acevedo, female   DOB: 08-13-1990, 27 y.o.   MRN: [id number]  PCP: McLean-Scocuzza, Pasty Spillersracy N, MD  Subjective:  Christina Acevedo is a 27 y.o. year old very pleasant female patient who presents with Upper Respiratory infection symptoms including nasal congestion, dry throat, cough that is productive of yellow sputum, and post nasal drip.  -started: one week ago , symptoms are worsening -previous treatments: Just started Xifaxin for IBS yesterday. She has tried mucinex which has provided limited benefit. -sick contacts/travel/risks: denies flu exposure. No recent sick contact exposure. No influenza vaccine this year -Hx of: migraines Associated nausea without vomiting  ROS-denies fever, SOB, no vomiting or diarrhea, tooth pain  Pertinent Past Medical History- IBS with constipation and diarrhea, seasonal allergies  Medications- reviewed  Current Outpatient Medications  Medication Sig Dispense Refill  . amitriptyline (ELAVIL) 25 MG tablet Take 1 tablet (25 mg total) by mouth at bedtime. 90 tablet 3  . Aspirin-Acetaminophen-Caffeine (EXCEDRIN MIGRAINE PO) Take by mouth.    . dicyclomine (BENTYL) 20 MG tablet Take 1 tablet (20 mg total) by mouth 4 (four) times daily -  before meals and at bedtime. 120 tablet 0  . LORazepam (ATIVAN) 0.5 MG tablet Take 1 tablet (0.5 mg total) by mouth daily as needed for anxiety. 30 tablet 0  . rifaximin (XIFAXAN) 550 MG TABS tablet Take 1 tablet (550 mg total) by mouth 3 (three) times daily. 90 tablet 3  . SPRINTEC 28 0.25-35 MG-MCG tablet TAKE 1 ACTIVE PILL EVERYDAY FOR 3 MONTHS  4   No current facility-administered medications for this visit.     Objective: BP 122/82 (BP Location: Left Arm, Patient Position: Sitting, Cuff Size: Normal)   Pulse 98   Temp 98.9 F (37.2 C) (Oral)   Resp 15   Wt 175 lb 4 oz (79.5 kg)   SpO2 97%   BMI 31.04 kg/m  Gen: NAD, resting comfortably HEENT: Turbinates erythematous, TMs normal, pharynx mildly  erythematous with no tonsilar exudate or edema, no sinus tenderness CV: RRR no murmurs rubs or gallops Lungs: CTAB no crackles, wheeze, rhonchi Abdomen: soft/nontender/nondistended/normal bowel sounds. No rebound or guarding.  Ext: no edema Skin: warm, dry, no rash Neuro: grossly normal, moves all extremities  Assessment/Plan:  Upper Respiratory infection History and exam today are suggestive of viral infection most likely due to upper respiratory infection. Symptomatic treatment with flonase and either Allegra, Claritin, or Zyrtec.  We discussed that we did not find any infection that had higher probability of being bacterial such as pneumonia or strep throat. We discussed signs that bacterial infection may have developed particularly fever or shortness of breath. Likely course of 1-2 weeks. Patient is contagious and advised good handwashing and consideration of mask If going to be in public places.   Finally, we reviewed reasons to return to care including if symptoms worsen or persist or new concerns arise- once again particularly shortness of breath or fever.  Inez CatalinaJulia Ann Jacorie Ernsberger, FNP

## 2017-07-14 NOTE — Patient Instructions (Signed)
Your symptoms are most likely related to a viral illness. Please drink plenty of water so that your urine is pale yellow or clear. Also, get plenty of rest and you can use flonase and either allegra, claritin, or zyrtec for symptoms.  If not improvement in 3 to 4 days or symptoms are worsening, please follow up for further evaluation and treatment.   Upper Respiratory Infection, Adult Most upper respiratory infections (URIs) are caused by a virus. A URI affects the nose, throat, and upper air passages. The most common type of URI is often called "the common cold." Follow these instructions at home:  Take medicines only as told by your doctor.  Gargle warm saltwater or take cough drops to comfort your throat as told by your doctor.  Use a warm mist humidifier or inhale steam from a shower to increase air moisture. This may make it easier to breathe.  Drink enough fluid to keep your pee (urine) clear or pale yellow.  Eat soups and other clear broths.  Have a healthy diet.  Rest as needed.  Go back to work when your fever is gone or your doctor says it is okay. ? You may need to stay home longer to avoid giving your URI to others. ? You can also wear a face mask and wash your hands often to prevent spread of the virus.  Use your inhaler more if you have asthma.  Do not use any tobacco products, including cigarettes, chewing tobacco, or electronic cigarettes. If you need help quitting, ask your doctor. Contact a doctor if:  You are getting worse, not better.  Your symptoms are not helped by medicine.  You have chills.  You are getting more short of breath.  You have brown or red mucus.  You have yellow or brown discharge from your nose.  You have pain in your face, especially when you bend forward.  You have a fever.  You have puffy (swollen) neck glands.  You have pain while swallowing.  You have white areas in the back of your throat. Get help right away if:  You  have very bad or constant: ? Headache. ? Ear pain. ? Pain in your forehead, behind your eyes, and over your cheekbones (sinus pain). ? Chest pain.  You have long-lasting (chronic) lung disease and any of the following: ? Wheezing. ? Long-lasting cough. ? Coughing up blood. ? A change in your usual mucus.  You have a stiff neck.  You have changes in your: ? Vision. ? Hearing. ? Thinking. ? Mood. This information is not intended to replace advice given to you by your health care provider. Make sure you discuss any questions you have with your health care provider. Document Released: 10/13/2007 Document Revised: 12/28/2015 Document Reviewed: 08/01/2013 Elsevier Interactive Patient Education  2018 [employer]

## 2017-07-26 ENCOUNTER — Other Ambulatory Visit: Payer: Self-pay

## 2017-07-26 DIAGNOSIS — K582 Mixed irritable bowel syndrome: Secondary | ICD-10-CM

## 2017-07-26 MED ORDER — DICYCLOMINE HCL 20 MG PO TABS
20.0000 mg | ORAL_TABLET | Freq: Three times a day (TID) | ORAL | 0 refills | Status: DC
Start: 2017-07-26 — End: 2017-08-03

## 2017-07-27 DIAGNOSIS — F411 Generalized anxiety disorder: Secondary | ICD-10-CM | POA: Diagnosis not present

## 2017-08-03 ENCOUNTER — Other Ambulatory Visit: Payer: Self-pay | Admitting: Internal Medicine

## 2017-08-03 DIAGNOSIS — K582 Mixed irritable bowel syndrome: Secondary | ICD-10-CM

## 2017-08-03 MED ORDER — DICYCLOMINE HCL 20 MG PO TABS: 20.0000 mg | ORAL_TABLET | Freq: Three times a day (TID) | ORAL | 0 refills | Status: DC

## 2017-08-05 DIAGNOSIS — F411 Generalized anxiety disorder: Secondary | ICD-10-CM | POA: Diagnosis not present

## 2017-08-09 ENCOUNTER — Other Ambulatory Visit: Payer: Self-pay | Admitting: Internal Medicine

## 2017-08-09 DIAGNOSIS — K582 Mixed irritable bowel syndrome: Secondary | ICD-10-CM

## 2017-08-09 MED ORDER — DICYCLOMINE HCL 20 MG PO TABS: 20.0000 mg | ORAL_TABLET | Freq: Three times a day (TID) | ORAL | 1 refills | Status: DC

## 2017-08-15 DIAGNOSIS — F411 Generalized anxiety disorder: Secondary | ICD-10-CM | POA: Diagnosis not present

## 2017-08-23 ENCOUNTER — Other Ambulatory Visit: Payer: Self-pay | Admitting: Internal Medicine

## 2017-08-23 DIAGNOSIS — F419 Anxiety disorder, unspecified: Secondary | ICD-10-CM

## 2017-08-24 ENCOUNTER — Ambulatory Visit: Payer: Self-pay

## 2017-08-24 DIAGNOSIS — F411 Generalized anxiety disorder: Secondary | ICD-10-CM | POA: Diagnosis not present

## 2017-08-24 NOTE — Telephone Encounter (Signed)
She needs appt  If cant make it urgent care would be an option  TMS

## 2017-08-24 NOTE — Telephone Encounter (Signed)
Pt c/o frequent urination and burning with urination. Sx started today.  She stated she will be able to come in for a visit due to her work schedule. Informed pt is usually required to have an appt. For this type of complaint. Pt requesting [facility] [address], [place]. Reason for Disposition . Urinating more frequently than usual (i.e., frequency)  Answer Assessment - Initial Assessment Questions 1. SYMPTOM: "What's the main symptom you're concerned about?" (e.g., frequency, incontinence)     Frequent urination, burning with urination 2. ONSET: "When did the  ________  start?"     today 3. PAIN: "Is there any pain?" If so, ask: "How bad is it?" (Scale: 1-10; mild, moderate, severe)     No pain 4. CAUSE: "What do you think is causing the symptoms?"     uti 5. OTHER SYMPTOMS: "Do you have any other symptoms?" (e.g., fever, flank pain, blood in urine, pain with urination)   Pain with urination 6. PREGNANCY: "Is there any chance you are pregnant?" "When was your last menstrual period?"     No LMP: 3 weeks ago  Protocols used: URINARY [id number]

## 2017-08-25 ENCOUNTER — Other Ambulatory Visit: Payer: Self-pay | Admitting: Internal Medicine

## 2017-08-25 DIAGNOSIS — F419 Anxiety disorder, unspecified: Secondary | ICD-10-CM

## 2017-08-25 MED ORDER — LORAZEPAM 0.5 MG PO TABS: 0.5000 mg | ORAL_TABLET | Freq: Every day | ORAL | 0 refills | Status: DC | PRN

## 2017-08-25 NOTE — Telephone Encounter (Signed)
Mycahrt message has been sent since phone line was busy.

## 2017-09-16 DIAGNOSIS — F411 Generalized anxiety disorder: Secondary | ICD-10-CM | POA: Diagnosis not present

## 2017-09-26 DIAGNOSIS — F411 Generalized anxiety disorder: Secondary | ICD-10-CM | POA: Diagnosis not present

## 2017-09-28 ENCOUNTER — Encounter: Payer: Self-pay | Admitting: Internal Medicine

## 2017-09-28 ENCOUNTER — Other Ambulatory Visit: Payer: Self-pay | Admitting: Internal Medicine

## 2017-09-28 DIAGNOSIS — F419 Anxiety disorder, unspecified: Secondary | ICD-10-CM

## 2017-09-29 MED ORDER — LORAZEPAM 0.5 MG PO TABS: 0.5000 mg | ORAL_TABLET | Freq: Every day | ORAL | 1 refills | Status: DC | PRN

## 2017-09-29 NOTE — Telephone Encounter (Signed)
Refill request for Ativan, last seen 08/25/17, last filled 08/25/17.  Please advise.

## 2017-10-03 ENCOUNTER — Other Ambulatory Visit: Payer: Self-pay | Admitting: Gastroenterology

## 2017-10-10 DIAGNOSIS — F411 Generalized anxiety disorder: Secondary | ICD-10-CM | POA: Diagnosis not present

## 2017-10-11 ENCOUNTER — Ambulatory Visit: Payer: BLUE CROSS/BLUE SHIELD | Admitting: Gastroenterology

## 2017-10-11 ENCOUNTER — Other Ambulatory Visit: Payer: Self-pay

## 2017-10-11 ENCOUNTER — Encounter: Payer: Self-pay | Admitting: Gastroenterology

## 2017-10-11 VITALS — BP 105/73 | HR 97 | Resp 18 | Ht 63.0 in | Wt 181.2 lb

## 2017-10-11 DIAGNOSIS — R1013 Epigastric pain: Secondary | ICD-10-CM | POA: Diagnosis not present

## 2017-10-11 DIAGNOSIS — K582 Mixed irritable bowel syndrome: Secondary | ICD-10-CM

## 2017-10-11 DIAGNOSIS — K580 Irritable bowel syndrome with diarrhea: Secondary | ICD-10-CM | POA: Diagnosis not present

## 2017-10-11 MED ORDER — AMITRIPTYLINE HCL 50 MG PO TABS: 50.0000 mg | ORAL_TABLET | Freq: Every day | ORAL | 0 refills | Status: DC
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
## 2017-10-28 ENCOUNTER — Telehealth: Payer: Self-pay | Admitting: Internal Medicine

## 2017-10-28 NOTE — Telephone Encounter (Signed)
Last seen and ordered [id number] Please advise.

## 2017-10-28 NOTE — Telephone Encounter (Signed)
Pt needs a Rx refill for LORazepam (ATIVAN) 0.5 MG tablet  [id number] [place], [place] - [address]  Call pt @ [phone]. Thank you!

## 2017-10-31 ENCOUNTER — Encounter: Payer: Self-pay | Admitting: Internal Medicine

## 2017-10-31 ENCOUNTER — Ambulatory Visit: Payer: BLUE CROSS/BLUE SHIELD | Admitting: Internal Medicine

## 2017-10-31 VITALS — BP 124/80 | HR 106 | Temp 98.3°F | Resp 18 | Ht 63.0 in | Wt 185.2 lb

## 2017-10-31 DIAGNOSIS — Z23 Encounter for immunization: Secondary | ICD-10-CM

## 2017-10-31 DIAGNOSIS — K580 Irritable bowel syndrome with diarrhea: Secondary | ICD-10-CM | POA: Diagnosis not present

## 2017-10-31 DIAGNOSIS — K2970 Gastritis, unspecified, without bleeding: Secondary | ICD-10-CM | POA: Diagnosis not present

## 2017-10-31 DIAGNOSIS — K582 Mixed irritable bowel syndrome: Secondary | ICD-10-CM | POA: Diagnosis not present

## 2017-10-31 DIAGNOSIS — K529 Noninfective gastroenteritis and colitis, unspecified: Secondary | ICD-10-CM | POA: Diagnosis not present

## 2017-10-31 DIAGNOSIS — N941 Unspecified dyspareunia: Secondary | ICD-10-CM

## 2017-10-31 DIAGNOSIS — R197 Diarrhea, unspecified: Secondary | ICD-10-CM

## 2017-10-31 DIAGNOSIS — F419 Anxiety disorder, unspecified: Secondary | ICD-10-CM

## 2017-10-31 MED ORDER — LORAZEPAM 0.5 MG PO TABS: 0.5000 mg | ORAL_TABLET | Freq: Every day | ORAL | 2 refills | Status: DC | PRN

## 2017-10-31 NOTE — Progress Notes (Signed)
Pre-visit discussion using our clinic review tool. No additional management support is needed unless otherwise documented below in the visit note.  
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
## 2017-10-31 NOTE — Patient Instructions (Addendum)
Please let me know about [facility] or [facility] my chart me  Try Amitriptyline 1/2 of 50 mg x 1 week then 1/2 of 1/2 of 50 mg x 1 week then stop  F/u in 3-4 months Please f/u in 1 month and 6 months nurse sch for hep B vaccine  Ask ob/gyn about hpv vaccines   Hepatitis B Vaccine, Recombinant injection What is this medicine? HEPATITIS B VACCINE (hep uh TAHY tis B VAK seen) is a vaccine. It is used to prevent an infection with the hepatitis B virus. This medicine may be used for other purposes; ask your health care provider or pharmacist if you have questions. COMMON BRAND NAME(S): Engerix-B, Recombivax HB What should I tell my health care provider before I take this medicine? They need to know if you have any of these conditions: -fever, infection -heart disease -hepatitis B infection -immune system problems -kidney disease -an unusual or allergic reaction to vaccines, yeast, other medicines, foods, dyes, or preservatives -pregnant or trying to get pregnant -breast-feeding How should I use this medicine? This vaccine is for injection into a muscle. It is given by a health care professional. A copy of Vaccine Information Statements will be given before each vaccination. Read this sheet carefully each time. The sheet may change frequently. Talk to your pediatrician regarding the use of this medicine in children. While this drug may be prescribed for children as young as newborn for selected conditions, precautions do apply. Overdosage: If you think you have taken too much of this medicine contact a poison control center or emergency room at once. NOTE: This medicine is only for you. Do not share this medicine with others. What if I miss a dose? It is important not to miss your dose. Call your doctor or health care professional if you are unable to keep an appointment. What may interact with this medicine? -medicines that suppress your immune function like adalimumab, anakinra,  infliximab -medicines to treat cancer -steroid medicines like prednisone or cortisone This list may not describe all possible interactions. Give your health care provider a list of all the medicines, herbs, non-prescription drugs, or dietary supplements you use. Also tell them if you smoke, drink alcohol, or use illegal drugs. Some items may interact with your medicine. What should I watch for while using this medicine? See your health care provider for all shots of this vaccine as directed. You must have 3 shots of this vaccine for protection from hepatitis B infection. Tell your doctor right away if you have any serious or unusual side effects after getting this vaccine. What side effects may I notice from receiving this medicine? Side effects that you should report to your doctor or health care professional as soon as possible: -allergic reactions like skin rash, itching or hives, swelling of the face, lips, or tongue -breathing problems -confused, irritated -fast, irregular heartbeat -flu-like syndrome -numb, tingling pain -seizures -unusually weak or tired Side effects that usually do not require medical attention (report to your doctor or health care professional if they continue or are bothersome): -diarrhea -fever -headache -loss of appetite -muscle pain -nausea -pain, redness, swelling, or irritation at site where injected -tiredness This list may not describe all possible side effects. Call your doctor for medical advice about side effects. You may report side effects to FDA at 1-800-FDA-1088. Where should I keep my medicine? This drug is given in a hospital or clinic and will not be stored at home. NOTE: This sheet is a  summary. It may not cover all possible information. If you have questions about this medicine, talk to your doctor, pharmacist, or health care provider.  2018 Elsevier/Gold Standard (2013-08-27 13:26:01)  Parke SimmersBland Diet A bland diet consists of foods that do  not have a lot of fat or fiber. Foods without fat or fiber are easier for the body to digest. They are also less likely to irritate your mouth, throat, stomach, and other parts of your gastrointestinal tract. A bland diet is sometimes called a BRAT diet. What is my plan? Your health care provider or dietitian may recommend specific changes to your diet to prevent and treat your symptoms, such as:  Eating small meals often.  Cooking food until it is soft enough to chew easily.  Chewing your food well.  Drinking fluids slowly.  Not eating foods that are very spicy, sour, or fatty.  Not eating citrus fruits, such as oranges and grapefruit.  What do I need to know about this diet?  Eat a variety of foods from the bland diet food list.  Do not follow a bland diet longer than you have to.  Ask your health care provider whether you should take vitamins. What foods can I eat? Grains  Hot cereals, such as cream of wheat. Bread, crackers, or tortillas made from refined white flour. Rice. Vegetables Canned or cooked vegetables. Mashed or boiled potatoes. Fruits Bananas. Applesauce. Other types of cooked or canned fruit with the skin and seeds removed, such as canned peaches or pears. Meats and Other Protein Sources Scrambled eggs. Creamy peanut butter or other nut butters. Lean, well-cooked meats, such as chicken or fish. Tofu. Soups or broths. Dairy Low-fat dairy products, such as milk, cottage cheese, or yogurt. Beverages Water. Herbal tea. Apple juice. Sweets and Desserts Pudding. Custard. Fruit gelatin. Ice cream. Fats and Oils Mild salad dressings. Canola or olive oil. The items listed above may not be a complete list of allowed foods or beverages. Contact your dietitian for more options. What foods are not recommended? Foods and ingredients that are often not recommended include:  Spicy foods, such as hot sauce or salsa.  Fried foods.  Sour foods, such as pickled or  fermented foods.  Raw vegetables or fruits, especially citrus or berries.  Caffeinated drinks.  Alcohol.  Strongly flavored seasonings or condiments.  The items listed above may not be a complete list of foods and beverages that are not allowed. Contact your dietitian for more information. This information is not intended to replace advice given to you by your health care provider. Make sure you discuss any questions you have with your health care provider. Document Released: 08/18/2015 Document Revised: 10/02/2015 Document Reviewed: 05/08/2014 Elsevier Interactive Patient Education  2018 [employer]  Diet for Irritable Bowel Syndrome When you have irritable bowel syndrome (IBS), the foods you eat and your eating habits are very important. IBS may cause various symptoms, such as abdominal pain, constipation, or diarrhea. Choosing the right foods can help ease discomfort caused by these symptoms. Work with your health care provider and dietitian to find the best eating plan to help control your symptoms. What general guidelines do I need to follow?  Keep a food diary. This will help you identify foods that cause symptoms. Write down: ? What you eat and when. ? What symptoms you have. ? When symptoms occur in relation to your meals.  Avoid foods that cause symptoms. Talk with your dietitian about other ways to get the same nutrients that  are in these foods.  Eat more foods that contain fiber. Take a fiber supplement if directed by your dietitian.  Eat your meals slowly, in a relaxed setting.  Aim to eat 5-6 small meals per day. Do not skip meals.  Drink enough fluids to keep your urine clear or pale yellow.  Ask your health care provider if you should take an over-the-counter probiotic during flare-ups to help restore healthy gut bacteria.  If you have cramping or diarrhea, try making your meals low in fat and high in carbohydrates. Examples of carbohydrates are pasta, rice,  whole grain breads and cereals, fruits, and vegetables.  If dairy products cause your symptoms to flare up, try eating less of them. You might be able to handle yogurt better than other dairy products because it contains bacteria that help with digestion. What foods are not recommended? The following are some foods and drinks that may worsen your symptoms:  Fatty foods, such as [place] fries.  Milk products, such as cheese or ice cream.  Chocolate.  Alcohol.  Products with caffeine, such as coffee.  Carbonated drinks, such as soda.  The items listed above may not be a complete list of foods and beverages to avoid. Contact your dietitian for more information. What foods are good sources of fiber? Your health care provider or dietitian may recommend that you eat more foods that contain fiber. Fiber can help reduce constipation and other IBS symptoms. Add foods with fiber to your diet a little at a time so that your body can get used to them. Too much fiber at once might cause gas and swelling of your abdomen. The following are some foods that are good sources of fiber:  Apples.  Peaches.  Pears.  Berries.  Figs.  Broccoli (raw).  Cabbage.  Carrots.  Raw peas.  Kidney beans.  Lima beans.  Whole grain bread.  Whole grain cereal.  Where to find more information: [employer] for Functional Gastrointestinal Disorders: www.iffgd.[employer] of Diabetes and Digestive and Kidney Diseases: http://norris-lawson.com/.aspx This information is not intended to replace advice given to you by your health care provider. Make sure you discuss any questions you have with your health care provider. Document Released: 07/17/2003 Document Revised: 10/02/2015 Document Reviewed: 07/27/2013 Elsevier Interactive Patient Education  2018 [employer].   Viberzi/Eluxadoline oral tablets What is this  medicine? ELUXADOLINE (ee LUX ah dol ine) is an intestinal disorder drug. It is used to treat irritable bowel syndrome with diarrhea. This medicine may be used for other purposes; ask your health care provider or pharmacist if you have questions. COMMON BRAND NAME(S): Viberzi What should I tell my health care provider before I take this medicine? They need to know if you have any of these conditions: -drink more than 3 alcohol-containing drinks per day -gallbladder disease -have no gallbladder -history of constipation -history of drug or alcohol abuse problems -history of pancreatitis or pancreatic disease -liver disease -stomach or intestine problems -an unusual or allergic reaction to eluxadoline, other medicines, foods, dyes, or preservatives -pregnant or trying to get pregnant -breast-feeding How should I use this medicine? Take this medicine by mouth with a glass of water. Follow the directions on the prescription label. Take this medicine with food. Take your medicine at regular intervals. Do not take it more often than directed. Do not stop taking except on your doctor's advice. Talk to your pediatrician regarding the use of this medicine in children. Special care may be needed. Overdosage: If you  think you have taken too much of this medicine contact a poison control center or emergency room at once. NOTE: This medicine is only for you. Do not share this medicine with others. What if I miss a dose? If you miss a dose, take it as soon as you can. If it is almost time for your next dose, take only that dose. Do not take double or extra doses. What may interact with this medicine? This medicine may interact with the following medications: -alosetron -anticholinergics -bupropion -certain antibiotics like ciprofloxacin, clarithromycin, rifampin -cyclosporine -eltrombopag -ergot alkaloids like dihydroergotamine, ergonovine, ergotamine,  methylergonovine -fluconazole -gemfibrozil -loperamide -opiate agonist -paroxetine -pimozide -probenecid -quinidine -rosuvastatin -sirolimus -tacrolimus This list may not describe all possible interactions. Give your health care provider a list of all the medicines, herbs, non-prescription drugs, or dietary supplements you use. Also tell them if you smoke, drink alcohol, or use illegal drugs. Some items may interact with your medicine. What should I watch for while using this medicine? Tell your doctor or healthcare professional if your symptoms do not start to get better or if they get worse. This medicine may cause constipation. If you do not have a bowel movement, call your doctor or healthcare professional. You may get drowsy or dizzy. Do not drive, use machinery, or do anything that needs mental alertness until you know how this medicine affects you. Do not stand or sit up quickly, especially if you are an older patient. This reduces the risk of dizzy or fainting spells. Alcohol may interfere with the effect of this medicine. Avoid alcoholic drinks. What side effects may I notice from receiving this medicine? Side effects that you should report to your doctor or health care professional as soon as possible: -allergic reactions like skin rash, itching or hives, swelling of the face, lips, or tongue -breathing problems -constipation -right upper belly pain Side effects that usually do not require medical attention (report to your doctor or health care professional if they continue or are bothersome): -dizziness -nausea, vomiting -tiredness This list may not describe all possible side effects. Call your doctor for medical advice about side effects. You may report side effects to FDA at 1-800-FDA-1088. Where should I keep my medicine? Keep out of the reach of children. Store at room temperature between 20 and 25 degrees C (68 and 77 degrees F). Throw away any unused medicine after the  expiration date. NOTE: This sheet is a summary. It may not cover all possible information. If you have questions about this medicine, talk to your doctor, pharmacist, or health care provider.  2018 Elsevier/Gold Standard (2015-07-24 11:03:55)

## 2017-11-03 ENCOUNTER — Encounter: Payer: Self-pay | Admitting: Internal Medicine

## 2017-11-04 DIAGNOSIS — F411 Generalized anxiety disorder: Secondary | ICD-10-CM | POA: Diagnosis not present

## 2017-11-25 DIAGNOSIS — F411 Generalized anxiety disorder: Secondary | ICD-10-CM | POA: Diagnosis not present

## 2017-11-30 ENCOUNTER — Ambulatory Visit (INDEPENDENT_AMBULATORY_CARE_PROVIDER_SITE_OTHER): Payer: BLUE CROSS/BLUE SHIELD | Admitting: *Deleted

## 2017-11-30 DIAGNOSIS — Z23 Encounter for immunization: Secondary | ICD-10-CM

## 2017-12-13 ENCOUNTER — Ambulatory Visit: Payer: BLUE CROSS/BLUE SHIELD | Admitting: Internal Medicine

## 2017-12-14 ENCOUNTER — Encounter: Payer: Self-pay | Admitting: Internal Medicine

## 2017-12-14 ENCOUNTER — Ambulatory Visit (INDEPENDENT_AMBULATORY_CARE_PROVIDER_SITE_OTHER): Payer: BLUE CROSS/BLUE SHIELD | Admitting: Internal Medicine

## 2017-12-14 VITALS — BP 120/80 | HR 118 | Temp 98.4°F | Ht 63.0 in | Wt 192.6 lb

## 2017-12-14 DIAGNOSIS — J329 Chronic sinusitis, unspecified: Secondary | ICD-10-CM | POA: Diagnosis not present

## 2017-12-14 DIAGNOSIS — H6690 Otitis media, unspecified, unspecified ear: Secondary | ICD-10-CM | POA: Diagnosis not present

## 2017-12-14 DIAGNOSIS — J029 Acute pharyngitis, unspecified: Secondary | ICD-10-CM

## 2017-12-14 MED ORDER — FLUCONAZOLE 150 MG PO TABS: 150.0000 mg | ORAL_TABLET | Freq: Once | ORAL | 0 refills | Status: AC

## 2017-12-14 MED ORDER — AZITHROMYCIN 250 MG PO TABS: ORAL_TABLET | ORAL | 0 refills | Status: DC

## 2017-12-14 MED ORDER — CIPROFLOXACIN-DEXAMETHASONE 0.3-0.1 % OT SUSP: 4.0000 [drp] | Freq: Two times a day (BID) | OTIC | 0 refills | Status: DC
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
## 2017-12-14 NOTE — Progress Notes (Signed)
Pre visit review using our clinic review tool, if applicable. No additional management support is needed unless otherwise documented below in the visit note. 

## 2017-12-14 NOTE — Patient Instructions (Addendum)
Call if you are not better by Friday   Earache, Adult An earache, or ear pain, can be caused by many things, including:  An infection.  Ear wax buildup.  Ear pressure.  Something in the ear that should not be there (foreign body).  A sore throat.  Tooth problems.  Jaw problems.  Treatment of the earache will depend on the cause. If the cause is not clear or cannot be determined, you may need to watch your symptoms until your earache goes away or until a cause is found. Follow these instructions at home: Pay attention to any changes in your symptoms. Take these actions to help with your pain:  Take or apply over-the-counter and prescription medicines only as told by your health care provider.  If you were prescribed an antibiotic medicine, use it as told by your health care provider. Do not stop using the antibiotic even if you start to feel better.  Do not put anything in your ear other than medicine that is prescribed by your health care provider.  If directed, apply heat to the affected area as often as told by your health care provider. Use the heat source that your health care provider recommends, such as a moist heat pack or a heating pad. ? Place a towel between your skin and the heat source. ? Leave the heat on for 20-30 minutes. ? Remove the heat if your skin turns bright red. This is especially important if you are unable to feel pain, heat, or cold. You may have a greater risk of getting burned.  If directed, put ice on the ear: ? Put ice in a plastic bag. ? Place a towel between your skin and the bag. ? Leave the ice on for 20 minutes, 2-3 times a day.  Try resting in an upright position instead of lying down. This may help to reduce pressure in your ear and relieve pain.  Chew gum if it helps to relieve your ear pain.  Treat any allergies as told by your health care provider.  Keep all follow-up visits as told by your health care provider. This is  important.  Contact a health care provider if:  Your pain does not improve within 2 days.  Your earache gets worse.  You have new symptoms.  You have a fever. Get help right away if:  You have a severe headache.  You have a stiff neck.  You have trouble swallowing.  You have redness or swelling behind your ear.  You have fluid or blood coming from your ear.  You have hearing loss.  You feel dizzy. This information is not intended to replace advice given to you by your health care provider. Make sure you discuss any questions you have with your health care provider. Document Released: 12/12/2003 Document Revised: 12/23/2015 Document Reviewed: 10/20/2015 Elsevier Interactive Patient Education  2018 Elsevier Inc.  Sore Throat A sore throat is pain, burning, irritation, or scratchiness in the throat. When you have a sore throat, you may feel pain or tenderness in your throat when you swallow or talk. Many things can cause a sore throat, including:  An infection.  Seasonal allergies.  Dryness in the air.  Irritants, such as smoke or pollution.  Gastroesophageal reflux disease (GERD).  A tumor.  A sore throat is often the first sign of another sickness. It may happen with other symptoms, such as coughing, sneezing, fever, and swollen neck glands. Most sore throats go away without medical treatment. Follow  these instructions at home:  Take over-the-counter medicines only as told by your health care provider.  Drink enough fluids to keep your urine clear or pale yellow.  Rest as needed.  To help with pain, try: ? Sipping warm liquids, such as broth, herbal tea, or warm water. ? Eating or drinking cold or frozen liquids, such as frozen ice pops. ? Gargling with a salt-water mixture 3-4 times a day or as needed. To make a salt-water mixture, completely dissolve -1 tsp of salt in 1 cup of warm water. ? Sucking on hard candy or throat lozenges. ? Putting a cool-mist  humidifier in your bedroom at night to moisten the air. ? Sitting in the bathroom with the door closed for 5-10 minutes while you run hot water in the shower.  Do not use any tobacco products, such as cigarettes, chewing tobacco, and e-cigarettes. If you need help quitting, ask your health care provider. Contact a health care provider if:  You have a fever for more than 2-3 days.  You have symptoms that last (are persistent) for more than 2-3 days.  Your throat does not get better within 7 days.  You have a fever and your symptoms suddenly get worse. Get help right away if:  You have difficulty breathing.  You cannot swallow fluids, soft foods, or your saliva.  You have increased swelling in your throat or neck.  You have persistent nausea and vomiting. This information is not intended to replace advice given to you by your health care provider. Make sure you discuss any questions you have with your health care provider. Document Released: 06/03/2004 Document Revised: 12/21/2015 Document Reviewed: 02/14/2015 Elsevier Interactive Patient Education  2018 [employer]  Sinusitis, Adult Sinusitis is soreness and inflammation of your sinuses. Sinuses are hollow spaces in the bones around your face. Your sinuses are located:  Around your eyes.  In the middle of your forehead.  Behind your nose.  In your cheekbones.  Your sinuses and nasal passages are lined with a stringy fluid (mucus). Mucus normally drains out of your sinuses. When your nasal tissues become inflamed or swollen, the mucus can become trapped or blocked so air cannot flow through your sinuses. This allows bacteria, viruses, and funguses to grow, which leads to infection. Sinusitis can develop quickly and last for 7?10 days (acute) or for more than 12 weeks (chronic). Sinusitis often develops after a cold. What are the causes? This condition is caused by anything that creates swelling in the sinuses or stops mucus  from draining, including:  Allergies.  Asthma.  Bacterial or viral infection.  Abnormally shaped bones between the nasal passages.  Nasal growths that contain mucus (nasal polyps).  Narrow sinus openings.  Pollutants, such as chemicals or irritants in the air.  A foreign object stuck in the nose.  A fungal infection. This is rare.  What increases the risk? The following factors may make you more likely to develop this condition:  Having allergies or asthma.  Having had a recent cold or respiratory tract infection.  Having structural deformities or blockages in your nose or sinuses.  Having a weak immune system.  Doing a lot of swimming or diving.  Overusing nasal sprays.  Smoking.  What are the signs or symptoms? The main symptoms of this condition are pain and a feeling of pressure around the affected sinuses. Other symptoms include:  Upper toothache.  Earache.  Headache.  Bad breath.  Decreased sense of smell and taste.  A  cough that may get worse at night.  Fatigue.  Fever.  Thick drainage from your nose. The drainage is often green and it may contain pus (purulent).  Stuffy nose or congestion.  Postnasal drip. This is when extra mucus collects in the throat or back of the nose.  Swelling and warmth over the affected sinuses.  Sore throat.  Sensitivity to light.  How is this diagnosed? This condition is diagnosed based on symptoms, a medical history, and a physical exam. To find out if your condition is acute or chronic, your health care provider may:  Look in your nose for signs of nasal polyps.  Tap over the affected sinus to check for signs of infection.  View the inside of your sinuses using an imaging device that has a light attached (endoscope).  If your health care provider suspects that you have chronic sinusitis, you may also:  Be tested for allergies.  Have a sample of mucus taken from your nose (nasal culture) and checked  for bacteria.  Have a mucus sample examined to see if your sinusitis is related to an allergy.  If your sinusitis does not respond to treatment and it lasts longer than 8 weeks, you may have an MRI or CT scan to check your sinuses. These scans also help to determine how severe your infection is. In rare cases, a bone biopsy may be done to rule out more serious types of fungal sinus disease. How is this treated? Treatment for sinusitis depends on the cause and whether your condition is chronic or acute. If a virus is causing your sinusitis, your symptoms will go away on their own within 10 days. You may be given medicines to relieve your symptoms, including:  Topical nasal decongestants. They shrink swollen nasal passages and let mucus drain from your sinuses.  Antihistamines. These drugs block inflammation that is triggered by allergies. This can help to ease swelling in your nose and sinuses.  Topical nasal corticosteroids. These are nasal sprays that ease inflammation and swelling in your nose and sinuses.  Nasal saline washes. These rinses can help to get rid of thick mucus in your nose.  If your condition is caused by bacteria, you will be given an antibiotic medicine. If your condition is caused by a fungus, you will be given an antifungal medicine. Surgery may be needed to correct underlying conditions, such as narrow nasal passages. Surgery may also be needed to remove polyps. Follow these instructions at home: Medicines  Take, use, or apply over-the-counter and prescription medicines only as told by your health care provider. These may include nasal sprays.  If you were prescribed an antibiotic medicine, take it as told by your health care provider. Do not stop taking the antibiotic even if you start to feel better. Hydrate and Humidify  Drink enough water to keep your urine clear or pale yellow. Staying hydrated will help to thin your mucus.  Use a cool mist humidifier to keep  the humidity level in your home above 50%.  Inhale steam for 10-15 minutes, 3-4 times a day or as told by your health care provider. You can do this in the bathroom while a hot shower is running.  Limit your exposure to cool or dry air. Rest  Rest as much as possible.  Sleep with your head raised (elevated).  Make sure to get enough sleep each night. General instructions  Apply a warm, moist washcloth to your face 3-4 times a day or as told by  your health care provider. This will help with discomfort.  Wash your hands often with soap and water to reduce your exposure to viruses and other germs. If soap and water are not available, use hand sanitizer.  Do not smoke. Avoid being around people who are smoking (secondhand smoke).  Keep all follow-up visits as told by your health care provider. This is important. Contact a health care provider if:  You have a fever.  Your symptoms get worse.  Your symptoms do not improve within 10 days. Get help right away if:  You have a severe headache.  You have persistent vomiting.  You have pain or swelling around your face or eyes.  You have vision problems.  You develop confusion.  Your neck is stiff.  You have trouble breathing. This information is not intended to replace advice given to you by your health care provider. Make sure you discuss any questions you have with your health care provider. Document Released: 04/26/2005 Document Revised: 12/21/2015 Document Reviewed: 02/19/2015 Elsevier Interactive Patient Education  [employer].

## 2017-12-16 ENCOUNTER — Telehealth: Payer: Self-pay | Admitting: Internal Medicine

## 2017-12-16 ENCOUNTER — Other Ambulatory Visit: Payer: Self-pay | Admitting: Internal Medicine

## 2017-12-16 DIAGNOSIS — J329 Chronic sinusitis, unspecified: Secondary | ICD-10-CM

## 2017-12-16 DIAGNOSIS — H6690 Otitis media, unspecified, unspecified ear: Secondary | ICD-10-CM

## 2017-12-16 MED ORDER — LEVOFLOXACIN 750 MG PO TABS: 750.0000 mg | ORAL_TABLET | Freq: Every day | ORAL | 0 refills | Status: DC

## 2017-12-16 MED ORDER — PREDNISONE 20 MG PO TABS: 20.0000 mg | ORAL_TABLET | Freq: Every day | ORAL | 0 refills | Status: DC

## 2017-12-16 NOTE — Telephone Encounter (Signed)
Patient still is not feeling well please see previous phone encounter.

## 2017-12-16 NOTE — Telephone Encounter (Signed)
Copied from CRM [id number]. Topic: General - Other >> Dec 16, 2017  8:30 AM Luanna Coleawoud, Jessica L wrote: Reason for CRM: pt called and stated that she was in the office to see [facility] 12/14/17 for a sinus infection and ear infection and stated that she is not feeling better. Pt has taken zpack and done ear drops and nothing has helped. Ears are still full. Pt has continued to have a fever. Pt not sure what fever is because she has been taking a fever reducer. Please advise

## 2017-12-16 NOTE — Telephone Encounter (Signed)
Try Levaquin 750 mg x 5 days with food  Prednisone 20 mg x 1 week  Sudafed otc rec take x 3 days and cont Flonase  If not better call back for appt  TMS

## 2017-12-16 NOTE — Telephone Encounter (Signed)
Pt calling to find out what she needs to do.

## 2017-12-16 NOTE — Telephone Encounter (Signed)
Please advise 

## 2017-12-23 ENCOUNTER — Other Ambulatory Visit: Payer: Self-pay | Admitting: Family Medicine

## 2017-12-23 DIAGNOSIS — J069 Acute upper respiratory infection, unspecified: Secondary | ICD-10-CM

## 2017-12-23 DIAGNOSIS — F411 Generalized anxiety disorder: Secondary | ICD-10-CM | POA: Diagnosis not present

## 2018-01-10 DIAGNOSIS — Z01419 Encounter for gynecological examination (general) (routine) without abnormal findings: Secondary | ICD-10-CM | POA: Diagnosis not present

## 2018-01-10 DIAGNOSIS — F4323 Adjustment disorder with mixed anxiety and depressed mood: Secondary | ICD-10-CM | POA: Diagnosis not present

## 2018-01-10 LAB — HM PAP SMEAR: HM Pap smear: NORMAL

## 2018-01-11 ENCOUNTER — Other Ambulatory Visit: Payer: Self-pay | Admitting: Gastroenterology

## 2018-01-11 ENCOUNTER — Ambulatory Visit: Payer: BLUE CROSS/BLUE SHIELD | Admitting: Gastroenterology

## 2018-01-11 DIAGNOSIS — K582 Mixed irritable bowel syndrome: Secondary | ICD-10-CM

## 2018-02-01 ENCOUNTER — Encounter: Payer: Self-pay | Admitting: Internal Medicine

## 2018-02-13 DIAGNOSIS — F5105 Insomnia due to other mental disorder: Secondary | ICD-10-CM | POA: Diagnosis not present

## 2018-02-13 DIAGNOSIS — F419 Anxiety disorder, unspecified: Secondary | ICD-10-CM | POA: Diagnosis not present

## 2018-02-13 DIAGNOSIS — F39 Unspecified mood [affective] disorder: Secondary | ICD-10-CM | POA: Diagnosis not present

## 2018-02-23 DIAGNOSIS — G43109 Migraine with aura, not intractable, without status migrainosus: Secondary | ICD-10-CM | POA: Diagnosis not present

## 2018-02-28 ENCOUNTER — Telehealth: Payer: Self-pay | Admitting: Internal Medicine

## 2018-02-28 NOTE — Telephone Encounter (Signed)
Mucinex DM green label or robitussin DM

## 2018-02-28 NOTE — Telephone Encounter (Signed)
Copied from CRM [id number]. Topic: General - Other >> Feb 28, 2018 11:50 AM Tamela Oddi wrote: Reason for CRM: Patient called to speak with the nurse to get advice on which over the counter medication she can take.  Patient stated that she has a cold and has been coughing and would like to take mucinex or sudafed over the counter.  Patient is not sure if it would interact with the current medications she is taking.  Please advise.  CB# [phone].

## 2018-02-28 NOTE — Telephone Encounter (Signed)
Please advise 

## 2018-02-28 NOTE — Telephone Encounter (Signed)
mychart message sent to inform patient. 

## 2018-03-01 NOTE — Telephone Encounter (Signed)
She can try but otherwise if not getting better needs a doctors appt   tMS

## 2018-03-01 NOTE — Telephone Encounter (Signed)
Left message for patient to return call back. [facility] may give information.  

## 2018-03-01 NOTE — Telephone Encounter (Signed)
Patient called back stating that had not received a call back-advised pt that there was a Clinical cytogeneticist message sent. Pt would like to know if she can also take levofloxacin (LEVAQUIN) 750 MG tablet that she was prescribed in August-stating that she has four tablets left. Pt is requesting a call back to discuss. Please advise.   Call back# [phone]

## 2018-03-02 ENCOUNTER — Ambulatory Visit: Payer: BLUE CROSS/BLUE SHIELD | Admitting: Internal Medicine

## 2018-03-08 DIAGNOSIS — F5105 Insomnia due to other mental disorder: Secondary | ICD-10-CM | POA: Diagnosis not present

## 2018-03-08 DIAGNOSIS — F39 Unspecified mood [affective] disorder: Secondary | ICD-10-CM | POA: Diagnosis not present

## 2018-03-08 DIAGNOSIS — F419 Anxiety disorder, unspecified: Secondary | ICD-10-CM | POA: Diagnosis not present

## 2018-03-23 ENCOUNTER — Other Ambulatory Visit: Payer: Self-pay | Admitting: Internal Medicine

## 2018-03-23 DIAGNOSIS — F419 Anxiety disorder, unspecified: Secondary | ICD-10-CM

## 2018-03-23 DIAGNOSIS — K582 Mixed irritable bowel syndrome: Secondary | ICD-10-CM

## 2018-03-23 MED ORDER — DICYCLOMINE HCL 20 MG PO TABS: 20.0000 mg | ORAL_TABLET | Freq: Three times a day (TID) | ORAL | 1 refills | Status: DC

## 2018-03-23 NOTE — Telephone Encounter (Signed)
Copied from CRM [id number]. Topic: Quick Communication - Rx Refill/Question >> Mar 23, 2018 10:57 AM Jaquita Rectoravis, Karen A wrote: Medication:LORazepam (ATIVAN) 0.5 MG tablet,  dicyclomine (BENTYL) 20 MG tablet   Has the patient contacted their pharmacy? Yes.     Preferred Pharmacy (with phone number or street name): [id number] - [place], [place] - [address] [phone] (Phone) [phone] (Fax)    Agent: Please be advised that RX refills may take up to 3 business days. We ask that you follow-up with your pharmacy.

## 2018-03-23 NOTE — Telephone Encounter (Signed)
Requested medication (s) are due for refill today: yes  Requested medication (s) are on the active medication list: yes    Last refill: 10/31/17  #30  2 refills  Future visit scheduled yes 03/30/18  Dr. Alford HighlandMcLean-S  Notes to clinic:not delegated  Requested Prescriptions  Pending Prescriptions Disp Refills   LORazepam (ATIVAN) 0.5 MG tablet 30 tablet 2    Sig: Take 1 tablet (0.5 mg total) by mouth daily as needed for anxiety.     Not Delegated - Psychiatry:  Anxiolytics/Hypnotics Failed - 03/23/2018 12:13 PM      Failed - This refill cannot be delegated      Failed - Urine Drug Screen completed in last 360 days.      Passed - Valid encounter within last 6 months    Recent Outpatient Visits          3 months ago Acute otitis media, unspecified otitis media type   [facility] [place] McLean-Scocuzza, Pasty Spillersracy N, MD   4 months ago Anxiety   [facility] [place] McLean-Scocuzza, Pasty Spillersracy N, MD   8 months ago Acute upper respiratory infection   [facility] [place] Kordsmeier, BrazosJulia, FNP   8 months ago Irritable bowel syndrome with both constipation and diarrhea   [facility] [place] McLean-Scocuzza, Pasty Spillersracy N, MD   9 months ago Lower abdominal pain   [facility] [place] McLean-Scocuzza, Pasty Spillersracy N, MD      Future Appointments            In 1 week McLean-Scocuzza, Pasty Spillersracy N, MD [facility] [place], [facility]         Signed Prescriptions Disp Refills   dicyclomine (BENTYL) 20 MG tablet 360 tablet 1    Sig: Take 1 tablet (20 mg total) by mouth 4 (four) times daily -  before meals and at bedtime.     Gastroenterology:  Antispasmodic Agents Failed - 03/23/2018 12:13 PM      Failed - Last Heart Rate in normal range    Pulse Readings from Last 1 Encounters:  12/14/17 (!) 118         Passed - Valid encounter within last 12 months    Recent Outpatient Visits          3 months ago Acute otitis media, unspecified otitis media  type   [facility] [place] McLean-Scocuzza, Pasty Spillersracy N, MD   4 months ago Anxiety   [facility] [place] McLean-Scocuzza, Pasty Spillersracy N, MD   8 months ago Acute upper respiratory infection   [facility] [place] Kordsmeier, [place], FNP   8 months ago Irritable bowel syndrome with both constipation and diarrhea   [facility] [place] McLean-Scocuzza, Pasty Spillersracy N, MD   9 months ago Lower abdominal pain    [facility] [place] McLean-Scocuzza, Pasty Spillersracy N, MD      Future Appointments            In 1 week McLean-Scocuzza, Pasty Spillersracy N, MD [facility] [place], [facility]

## 2018-03-27 MED ORDER — LORAZEPAM 0.5 MG PO TABS: 0.5000 mg | ORAL_TABLET | Freq: Every day | ORAL | 2 refills | Status: DC | PRN

## 2018-03-30 ENCOUNTER — Ambulatory Visit: Payer: BLUE CROSS/BLUE SHIELD | Admitting: Internal Medicine

## 2018-04-05 DIAGNOSIS — M9901 Segmental and somatic dysfunction of cervical region: Secondary | ICD-10-CM | POA: Diagnosis not present

## 2018-04-05 DIAGNOSIS — M546 Pain in thoracic spine: Secondary | ICD-10-CM | POA: Diagnosis not present

## 2018-04-05 DIAGNOSIS — M5417 Radiculopathy, lumbosacral region: Secondary | ICD-10-CM | POA: Diagnosis not present

## 2018-04-05 DIAGNOSIS — M5418 Radiculopathy, sacral and sacrococcygeal region: Secondary | ICD-10-CM | POA: Diagnosis not present

## 2018-04-05 DIAGNOSIS — R51 Headache: Secondary | ICD-10-CM | POA: Diagnosis not present

## 2018-04-05 DIAGNOSIS — M7918 Myalgia, other site: Secondary | ICD-10-CM | POA: Diagnosis not present

## 2018-04-05 DIAGNOSIS — M25474 Effusion, right foot: Secondary | ICD-10-CM | POA: Diagnosis not present

## 2018-04-05 DIAGNOSIS — M9905 Segmental and somatic dysfunction of pelvic region: Secondary | ICD-10-CM | POA: Diagnosis not present

## 2018-04-05 DIAGNOSIS — M9904 Segmental and somatic dysfunction of sacral region: Secondary | ICD-10-CM | POA: Diagnosis not present

## 2018-04-05 DIAGNOSIS — M9900 Segmental and somatic dysfunction of head region: Secondary | ICD-10-CM | POA: Diagnosis not present

## 2018-04-05 DIAGNOSIS — M542 Cervicalgia: Secondary | ICD-10-CM | POA: Diagnosis not present

## 2018-04-05 DIAGNOSIS — M7741 Metatarsalgia, right foot: Secondary | ICD-10-CM | POA: Diagnosis not present

## 2018-04-05 DIAGNOSIS — M9902 Segmental and somatic dysfunction of thoracic region: Secondary | ICD-10-CM | POA: Diagnosis not present

## 2018-04-11 DIAGNOSIS — M5418 Radiculopathy, sacral and sacrococcygeal region: Secondary | ICD-10-CM | POA: Diagnosis not present

## 2018-04-11 DIAGNOSIS — M542 Cervicalgia: Secondary | ICD-10-CM | POA: Diagnosis not present

## 2018-04-11 DIAGNOSIS — R51 Headache: Secondary | ICD-10-CM | POA: Diagnosis not present

## 2018-04-11 DIAGNOSIS — M5417 Radiculopathy, lumbosacral region: Secondary | ICD-10-CM | POA: Diagnosis not present

## 2018-04-12 DIAGNOSIS — M5418 Radiculopathy, sacral and sacrococcygeal region: Secondary | ICD-10-CM | POA: Diagnosis not present

## 2018-04-12 DIAGNOSIS — R51 Headache: Secondary | ICD-10-CM | POA: Diagnosis not present

## 2018-04-12 DIAGNOSIS — M5417 Radiculopathy, lumbosacral region: Secondary | ICD-10-CM | POA: Diagnosis not present

## 2018-04-12 DIAGNOSIS — M542 Cervicalgia: Secondary | ICD-10-CM | POA: Diagnosis not present

## 2018-04-13 DIAGNOSIS — R51 Headache: Secondary | ICD-10-CM | POA: Diagnosis not present

## 2018-04-13 DIAGNOSIS — M5418 Radiculopathy, sacral and sacrococcygeal region: Secondary | ICD-10-CM | POA: Diagnosis not present

## 2018-04-13 DIAGNOSIS — M542 Cervicalgia: Secondary | ICD-10-CM | POA: Diagnosis not present

## 2018-04-13 DIAGNOSIS — M5417 Radiculopathy, lumbosacral region: Secondary | ICD-10-CM | POA: Diagnosis not present

## 2018-04-18 DIAGNOSIS — M542 Cervicalgia: Secondary | ICD-10-CM | POA: Diagnosis not present

## 2018-04-18 DIAGNOSIS — M5418 Radiculopathy, sacral and sacrococcygeal region: Secondary | ICD-10-CM | POA: Diagnosis not present

## 2018-04-18 DIAGNOSIS — M5417 Radiculopathy, lumbosacral region: Secondary | ICD-10-CM | POA: Diagnosis not present

## 2018-04-18 DIAGNOSIS — R51 Headache: Secondary | ICD-10-CM | POA: Diagnosis not present

## 2018-04-19 DIAGNOSIS — M5417 Radiculopathy, lumbosacral region: Secondary | ICD-10-CM | POA: Diagnosis not present

## 2018-04-19 DIAGNOSIS — M5418 Radiculopathy, sacral and sacrococcygeal region: Secondary | ICD-10-CM | POA: Diagnosis not present

## 2018-04-19 DIAGNOSIS — M542 Cervicalgia: Secondary | ICD-10-CM | POA: Diagnosis not present

## 2018-04-19 DIAGNOSIS — R51 Headache: Secondary | ICD-10-CM | POA: Diagnosis not present

## 2018-04-26 DIAGNOSIS — M5418 Radiculopathy, sacral and sacrococcygeal region: Secondary | ICD-10-CM | POA: Diagnosis not present

## 2018-04-26 DIAGNOSIS — R51 Headache: Secondary | ICD-10-CM | POA: Diagnosis not present

## 2018-04-26 DIAGNOSIS — M542 Cervicalgia: Secondary | ICD-10-CM | POA: Diagnosis not present

## 2018-04-26 DIAGNOSIS — M5417 Radiculopathy, lumbosacral region: Secondary | ICD-10-CM | POA: Diagnosis not present

## 2018-05-01 DIAGNOSIS — M542 Cervicalgia: Secondary | ICD-10-CM | POA: Diagnosis not present

## 2018-05-01 DIAGNOSIS — R51 Headache: Secondary | ICD-10-CM | POA: Diagnosis not present

## 2018-05-01 DIAGNOSIS — M5418 Radiculopathy, sacral and sacrococcygeal region: Secondary | ICD-10-CM | POA: Diagnosis not present

## 2018-05-01 DIAGNOSIS — M5417 Radiculopathy, lumbosacral region: Secondary | ICD-10-CM | POA: Diagnosis not present

## 2018-05-02 ENCOUNTER — Ambulatory Visit: Payer: BLUE CROSS/BLUE SHIELD

## 2018-05-02 ENCOUNTER — Ambulatory Visit (INDEPENDENT_AMBULATORY_CARE_PROVIDER_SITE_OTHER): Payer: BLUE CROSS/BLUE SHIELD

## 2018-05-02 DIAGNOSIS — Z23 Encounter for immunization: Secondary | ICD-10-CM | POA: Diagnosis not present

## 2018-05-02 NOTE — Progress Notes (Signed)
Pt was seen today for her 3rd dose of Hep B given IM in the [place]. Pt tolerated well.

## 2018-05-22 DIAGNOSIS — M542 Cervicalgia: Secondary | ICD-10-CM | POA: Diagnosis not present

## 2018-05-22 DIAGNOSIS — M5417 Radiculopathy, lumbosacral region: Secondary | ICD-10-CM | POA: Diagnosis not present

## 2018-05-22 DIAGNOSIS — R51 Headache: Secondary | ICD-10-CM | POA: Diagnosis not present

## 2018-05-22 DIAGNOSIS — M5418 Radiculopathy, sacral and sacrococcygeal region: Secondary | ICD-10-CM | POA: Diagnosis not present

## 2018-05-25 ENCOUNTER — Other Ambulatory Visit: Payer: Self-pay | Admitting: Internal Medicine

## 2018-05-25 ENCOUNTER — Encounter: Payer: Self-pay | Admitting: Internal Medicine

## 2018-05-25 DIAGNOSIS — F419 Anxiety disorder, unspecified: Secondary | ICD-10-CM

## 2018-05-25 MED ORDER — LORAZEPAM 0.5 MG PO TABS: 0.5000 mg | ORAL_TABLET | Freq: Every day | ORAL | 2 refills | Status: DC | PRN

## 2018-06-05 DIAGNOSIS — F5105 Insomnia due to other mental disorder: Secondary | ICD-10-CM | POA: Diagnosis not present

## 2018-06-05 DIAGNOSIS — F419 Anxiety disorder, unspecified: Secondary | ICD-10-CM | POA: Diagnosis not present

## 2018-06-05 DIAGNOSIS — F39 Unspecified mood [affective] disorder: Secondary | ICD-10-CM | POA: Diagnosis not present

## 2018-06-09 DIAGNOSIS — F411 Generalized anxiety disorder: Secondary | ICD-10-CM | POA: Diagnosis not present

## 2018-08-09 ENCOUNTER — Other Ambulatory Visit: Payer: Self-pay | Admitting: Internal Medicine

## 2018-08-09 ENCOUNTER — Encounter: Payer: Self-pay | Admitting: Internal Medicine

## 2018-08-09 DIAGNOSIS — F419 Anxiety disorder, unspecified: Principal | ICD-10-CM

## 2018-08-09 DIAGNOSIS — F329 Major depressive disorder, single episode, unspecified: Secondary | ICD-10-CM

## 2018-08-09 DIAGNOSIS — F32A Depression, unspecified: Secondary | ICD-10-CM

## 2018-08-09 MED ORDER — BUSPIRONE HCL 10 MG PO TABS: 10.0000 mg | ORAL_TABLET | Freq: Two times a day (BID) | ORAL | 0 refills | Status: DC

## 2018-08-09 MED ORDER — SERTRALINE HCL 100 MG PO TABS: 100.0000 mg | ORAL_TABLET | Freq: Every day | ORAL | 0 refills | Status: DC

## 2018-11-09 ENCOUNTER — Other Ambulatory Visit: Payer: Self-pay | Admitting: Internal Medicine

## 2018-11-09 DIAGNOSIS — F419 Anxiety disorder, unspecified: Secondary | ICD-10-CM

## 2018-11-09 DIAGNOSIS — F329 Major depressive disorder, single episode, unspecified: Secondary | ICD-10-CM

## 2018-11-09 DIAGNOSIS — F32A Depression, unspecified: Secondary | ICD-10-CM

## 2018-11-09 MED ORDER — BUSPIRONE HCL 10 MG PO TABS: 10.0000 mg | ORAL_TABLET | Freq: Two times a day (BID) | ORAL | 3 refills | Status: DC

## 2018-12-11 ENCOUNTER — Other Ambulatory Visit: Payer: Self-pay

## 2018-12-11 ENCOUNTER — Other Ambulatory Visit: Payer: Self-pay | Admitting: Internal Medicine

## 2018-12-11 DIAGNOSIS — F32A Depression, unspecified: Secondary | ICD-10-CM

## 2018-12-11 DIAGNOSIS — F329 Major depressive disorder, single episode, unspecified: Secondary | ICD-10-CM

## 2018-12-11 DIAGNOSIS — F419 Anxiety disorder, unspecified: Secondary | ICD-10-CM

## 2018-12-11 MED ORDER — SERTRALINE HCL 100 MG PO TABS: 100.0000 mg | ORAL_TABLET | Freq: Every day | ORAL | 0 refills | Status: DC

## 2018-12-11 NOTE — Telephone Encounter (Signed)
Call pt to schedule yearly f/u   Thanks Viking

## 2019-01-23 ENCOUNTER — Other Ambulatory Visit: Payer: Self-pay | Admitting: Internal Medicine

## 2019-01-23 DIAGNOSIS — B3731 Acute candidiasis of vulva and vagina: Secondary | ICD-10-CM

## 2019-01-23 DIAGNOSIS — B373 Candidiasis of vulva and vagina: Secondary | ICD-10-CM

## 2019-01-23 MED ORDER — FLUCONAZOLE 150 MG PO TABS: 150.0000 mg | ORAL_TABLET | Freq: Once | ORAL | 0 refills | Status: AC

## 2019-05-17 ENCOUNTER — Ambulatory Visit: Payer: Commercial Managed Care - PPO | Attending: Internal Medicine

## 2019-05-17 DIAGNOSIS — Z20822 Contact with and (suspected) exposure to covid-19: Secondary | ICD-10-CM

## 2019-05-19 LAB — NOVEL CORONAVIRUS, NAA: SARS-CoV-2, NAA: NOT DETECTED

## 2020-05-28 ENCOUNTER — Telehealth (INDEPENDENT_AMBULATORY_CARE_PROVIDER_SITE_OTHER): Payer: Managed Care, Other (non HMO) | Admitting: Internal Medicine

## 2020-05-28 ENCOUNTER — Encounter: Payer: Self-pay | Admitting: Internal Medicine

## 2020-05-28 VITALS — Ht 63.0 in | Wt 192.6 lb

## 2020-05-28 DIAGNOSIS — Z0184 Encounter for antibody response examination: Secondary | ICD-10-CM | POA: Diagnosis not present

## 2020-05-28 DIAGNOSIS — Z20822 Contact with and (suspected) exposure to covid-19: Secondary | ICD-10-CM

## 2020-05-28 DIAGNOSIS — E559 Vitamin D deficiency, unspecified: Secondary | ICD-10-CM

## 2020-05-28 DIAGNOSIS — Z1389 Encounter for screening for other disorder: Secondary | ICD-10-CM

## 2020-05-28 DIAGNOSIS — Z1329 Encounter for screening for other suspected endocrine disorder: Secondary | ICD-10-CM

## 2020-05-28 DIAGNOSIS — E669 Obesity, unspecified: Secondary | ICD-10-CM | POA: Diagnosis not present

## 2020-05-28 DIAGNOSIS — R591 Generalized enlarged lymph nodes: Secondary | ICD-10-CM | POA: Diagnosis not present

## 2020-05-28 DIAGNOSIS — Z113 Encounter for screening for infections with a predominantly sexual mode of transmission: Secondary | ICD-10-CM

## 2020-05-28 DIAGNOSIS — Z13818 Encounter for screening for other digestive system disorders: Secondary | ICD-10-CM

## 2020-05-28 DIAGNOSIS — Z0000 Encounter for general adult medical examination without abnormal findings: Secondary | ICD-10-CM

## 2020-05-28 NOTE — Progress Notes (Signed)
Virtual Visit via Video Note  I connected with Christina Acevedo  on 05/28/20 at  1:10 PM EST by a video enabled telemedicine application and verified that I am speaking with the correct person using two identifiers.  Location patient: home, [place] Location provider:work or home office Persons participating in the virtual visit: patient, provider  I discussed the limitations of evaluation and management by telemedicine and the availability of in person appointments. The patient expressed understanding and agreed to proceed.   HPI: 1. Allergies/sinusitis xmas day which resolved after days and had lymph node swelling which has reduced in size under chin right side it was protruding still there but reduced in size feels firm but not rock hard and able to move and she had swelling as a child also she had swollen glands behind right ear which are sore with pressing Denies flu like sxs, sore throat, ear pain sick sxs  She did have covid exp 05/24/20  -if needed consider ENT Dr. Jenne Campus in the future   2. Obesity lost 20 lbs and goal is total 50 so 30 more lbs   3.HSV 1/2 on valtrex 1000 mg qd b/c has new partner  ROS: See pertinent positives and negatives per HPI.  Past Medical History:  Diagnosis Date  . Anxiety   . Chicken pox   . Chronic headache    migraines   . 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 HSV (herpes simplex virus) infection    1/2 as of 02/2020  . IBS (irritable bowel syndrome)    mixed  . UTI (urinary tract infection)   . UTI (urinary tract infection)    E coli     Past Surgical History:  Procedure Laterality Date  . COLONOSCOPY WITH PROPOFOL N/A 07/04/2017   Procedure: COLONOSCOPY WITH PROPOFOL;  Surgeon: Toney Reil, MD;  Location: [facility] ENDOSCOPY;  Service: Gastroenterology;  Laterality: N/A;  . ESOPHAGOGASTRODUODENOSCOPY (EGD) WITH PROPOFOL N/A 07/04/2017   Procedure: ESOPHAGOGASTRODUODENOSCOPY (EGD) WITH PROPOFOL;  Surgeon: Toney Reil, MD;   Location: [facility] ENDOSCOPY;  Service: Gastroenterology;  Laterality: N/A;  . TONSILLECTOMY     2008/2009  . Wisdom Teeth Removal       Current Outpatient Medications:  .  Aspirin-Acetaminophen-Caffeine (EXCEDRIN MIGRAINE PO), Take by mouth., Disp: , Rfl:  .  Multiple Vitamin (MULTIVITAMIN) tablet, Take by mouth daily., Disp: , Rfl:  .  Omega-3 Fatty Acids (FISH OIL PO), Take by mouth., Disp: , Rfl:  .  SPRINTEC 28 0.25-35 MG-MCG tablet, TAKE 1 ACTIVE PILL EVERYDAY FOR 3 MONTHS, Disp: , Rfl: 4 .  valACYclovir (VALTREX) 1000 MG tablet, Take 1,000 mg by mouth daily., Disp: , Rfl:   EXAM:  VITALS per patient if applicable:  GENERAL: alert, oriented, appears well and in no acute distress  HEENT: atraumatic, conjunttiva clear, no obvious abnormalities on inspection of external nose and ears  NECK: normal movements of the head and neck  LUNGS: on inspection no signs of respiratory distress, breathing rate appears normal, no obvious gross SOB, gasping or wheezing  CV: no obvious cyanosis  MS: moves all visible extremities without noticeable abnormality  PSYCH/NEURO: pleasant and cooperative, no obvious depression or anxiety, speech and thought processing grossly intact  ASSESSMENT AND PLAN:  Discussed the following assessment and plan:  Close exposure to COVID-19 virus 05/24/20 - Plan: Novel Coronavirus, NAA ([facility]) Lymphadenopathy - Plan: US SOFT TISSUE HEAD & NECK (NON-THYROID), CBC with Differential/Platelet Lymphadenopathy of head and neck - Plan: US  SOFT TISSUE HEAD & NECK (NON-THYROID) Pt thinks she had covid 06/2018  Consider ENT in the future Dr. Jenne Campus   Obesity (BMI 30-39.9) rec healthy diet and exercise   HM Reviewed [facility] notes  Hemochromatosis 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 No flu never had declines, declines covid 3/3 hep B  Consider Tdap with lab visit upcoming HSV 1/2 d/x 02/2020 on valtrex 1000 mg qd  HIV neg 02/14/20 Consider hep C testing   Pap 05/16/14 neg  pap no comment on HPV testing  Pap 01/10/18 neg no HPV done   GC/C neg 03/11/16  Saw Dr. Dalbert Garnet 01/10/18 recurrent BV pap done neg no HPV  rec healthy diet and exercise   Saw Dr. Maryruth Bun 02/14/18 anxiety/depression Rx Bupropion XL 150 mg qd, ativan 0.5 mg qd prn GAD 7 score 18 PHQ 9 score 14 will plant to start zoloft 50 mg qd and consider add trazadone 50-100 mg qhs insomnia  adipex likely worsening anxiety  Saw psych 03/08/18 on zoloft 100 mg qd ativan 0.5 mg qd prn trazodone 50 mg qd can increase to 2,  Dr. Maryruth Bun 06/05/2018 zoloft 100 mg qd added buspirone 10 mg bid stopped ativan 0.5   -we discussed possible serious and likely etiologies, options for evaluation and workup, limitations of telemedicine visit vs in person visit, treatment, treatment risks and precautions.     I discussed the assessment and treatment plan with the patient. The patient was provided an opportunity to ask questions and all were answered. The patient agreed with the plan and demonstrated an understanding of the instructions.    Time spent 20 min  Bevelyn Buckles, MD

## 2020-05-29 ENCOUNTER — Other Ambulatory Visit (INDEPENDENT_AMBULATORY_CARE_PROVIDER_SITE_OTHER): Payer: Managed Care, Other (non HMO)

## 2020-05-29 DIAGNOSIS — Z20822 Contact with and (suspected) exposure to covid-19: Secondary | ICD-10-CM

## 2020-05-31 LAB — SARS-COV-2, NAA 2 DAY TAT

## 2020-05-31 LAB — NOVEL CORONAVIRUS, NAA: SARS-CoV-2, NAA: NOT DETECTED

## 2020-06-12 ENCOUNTER — Ambulatory Visit: Payer: Managed Care, Other (non HMO)

## 2020-06-12 ENCOUNTER — Other Ambulatory Visit: Payer: Managed Care, Other (non HMO)

## 2020-06-20 ENCOUNTER — Ambulatory Visit
Admission: RE | Admit: 2020-06-20 | Discharge: 2020-06-20 | Disposition: A | Discharge status: Home / Self Care | Payer: Managed Care, Other (non HMO) | Source: Ambulatory Visit | Attending: Internal Medicine | Admitting: Internal Medicine

## 2020-06-20 ENCOUNTER — Other Ambulatory Visit: Payer: Self-pay

## 2020-06-20 DIAGNOSIS — R591 Generalized enlarged lymph nodes: Secondary | ICD-10-CM | POA: Diagnosis present

## 2020-06-25 ENCOUNTER — Other Ambulatory Visit (INDEPENDENT_AMBULATORY_CARE_PROVIDER_SITE_OTHER): Payer: Managed Care, Other (non HMO)

## 2020-06-25 ENCOUNTER — Other Ambulatory Visit: Payer: Self-pay

## 2020-06-25 DIAGNOSIS — Z1329 Encounter for screening for other suspected endocrine disorder: Secondary | ICD-10-CM

## 2020-06-25 DIAGNOSIS — Z0000 Encounter for general adult medical examination without abnormal findings: Secondary | ICD-10-CM

## 2020-06-25 DIAGNOSIS — Z0184 Encounter for antibody response examination: Secondary | ICD-10-CM

## 2020-06-25 DIAGNOSIS — Z113 Encounter for screening for infections with a predominantly sexual mode of transmission: Secondary | ICD-10-CM

## 2020-06-25 DIAGNOSIS — E559 Vitamin D deficiency, unspecified: Secondary | ICD-10-CM

## 2020-06-25 DIAGNOSIS — Z13818 Encounter for screening for other digestive system disorders: Secondary | ICD-10-CM

## 2020-06-25 DIAGNOSIS — R591 Generalized enlarged lymph nodes: Secondary | ICD-10-CM | POA: Diagnosis not present

## 2020-06-25 DIAGNOSIS — Z1389 Encounter for screening for other disorder: Secondary | ICD-10-CM

## 2020-06-25 LAB — COMPREHENSIVE METABOLIC PANEL
ALT: 13 U/L (ref 0–35)
AST: 12 U/L (ref 0–37)
Albumin: 4.1 g/dL (ref 3.5–5.2)
Alkaline Phosphatase: 50 U/L (ref 39–117)
BUN: 9 mg/dL (ref 6–23)
CO2: 24 mEq/L (ref 19–32)
Calcium: 9.4 mg/dL (ref 8.4–10.5)
Chloride: 104 mEq/L (ref 96–112)
Creatinine, Ser: 0.72 mg/dL (ref 0.40–1.20)
GFR: 113.67 mL/min (ref >60.00)
Glucose, Bld: 82 mg/dL (ref 70–99)
Potassium: 3.9 mEq/L (ref 3.5–5.1)
Sodium: 136 mEq/L (ref 135–145)
Total Bilirubin: 0.5 mg/dL (ref 0.2–1.2)
Total Protein: 6.6 g/dL (ref 6.0–8.3)

## 2020-06-25 LAB — CBC WITH DIFFERENTIAL/PLATELET
Basophils Absolute: 0.1 10*3/uL (ref 0.0–0.1)
Basophils Relative: 0.5 % (ref 0.0–3.0)
Eosinophils Absolute: 0.4 10*3/uL (ref 0.0–0.7)
Eosinophils Relative: 3.3 % (ref 0.0–5.0)
HCT: 38.3 % (ref 36.0–46.0)
Hemoglobin: 13.3 g/dL (ref 12.0–15.0)
Lymphocytes Relative: 34 % (ref 12.0–46.0)
Lymphs Abs: 4 10*3/uL (ref 0.7–4.0)
MCHC: 34.6 g/dL (ref 30.0–36.0)
MCV: 89.9 fl (ref 78.0–100.0)
Monocytes Absolute: 0.6 10*3/uL (ref 0.1–1.0)
Monocytes Relative: 5 % (ref 3.0–12.0)
Neutro Abs: 6.7 10*3/uL (ref 1.4–7.7)
Neutrophils Relative %: 57.2 % (ref 43.0–77.0)
Platelets: 374 10*3/uL (ref 150.0–400.0)
RBC: 4.26 Mil/uL (ref 3.87–5.11)
RDW: 12.5 % (ref 11.5–15.5)
WBC: 11.8 10*3/uL — ABNORMAL HIGH (ref 4.0–10.5)

## 2020-06-25 LAB — LIPID PANEL
Cholesterol: 223 mg/dL — ABNORMAL HIGH (ref 0–200)
HDL: 53.2 mg/dL (ref >39.00)
NonHDL: 169.64
Total CHOL/HDL Ratio: 4
Triglycerides: 239 mg/dL — ABNORMAL HIGH (ref 0.0–149.0)
VLDL: 47.8 mg/dL — ABNORMAL HIGH (ref 0.0–40.0)

## 2020-06-25 LAB — TSH: TSH: 3.36 u[IU]/mL (ref 0.35–4.50)

## 2020-06-25 LAB — LDL CHOLESTEROL, DIRECT: Direct LDL: 150 mg/dL

## 2020-06-25 LAB — VITAMIN D 25 HYDROXY (VIT D DEFICIENCY, FRACTURES): VITD: 28.54 ng/mL — ABNORMAL LOW (ref 30.00–100.00)

## 2020-06-26 LAB — URINALYSIS, ROUTINE W REFLEX MICROSCOPIC
Bilirubin, UA: NEGATIVE
Glucose, UA: NEGATIVE
Nitrite, UA: NEGATIVE
RBC, UA: NEGATIVE
Specific Gravity, UA: >=1.03 — AB (ref 1.005–1.030)
Urobilinogen, Ur: 0.2 mg/dL (ref 0.2–1.0)
pH, UA: 5 (ref 5.0–7.5)

## 2020-06-26 LAB — MICROSCOPIC EXAMINATION
Casts: NONE SEEN /lpf
Epithelial Cells (non renal): >10 /hpf — AB (ref 0–10)
RBC, Urine: NONE SEEN /hpf (ref 0–2)
WBC, UA: >30 /hpf — AB (ref 0–5)

## 2020-06-26 LAB — HEPATITIS C ANTIBODY: Hep C Virus Ab: <0.1 s/co ratio (ref 0.0–0.9)

## 2020-06-26 LAB — HEPATITIS B SURFACE ANTIBODY, QUANTITATIVE: Hepatitis B Surf Ab Quant: 6.5 m[IU]/mL — ABNORMAL LOW (ref >9.9)

## 2020-07-22 ENCOUNTER — Encounter: Payer: Self-pay | Admitting: Internal Medicine

## 2020-07-22 ENCOUNTER — Ambulatory Visit: Payer: Managed Care, Other (non HMO) | Admitting: Internal Medicine

## 2020-07-22 ENCOUNTER — Other Ambulatory Visit: Payer: Self-pay

## 2020-07-22 VITALS — BP 124/88 | HR 89 | Temp 98.0°F | Resp 16 | Ht 63.0 in | Wt 178.6 lb

## 2020-07-22 DIAGNOSIS — Z148 Genetic carrier of other disease: Secondary | ICD-10-CM

## 2020-07-22 DIAGNOSIS — F419 Anxiety disorder, unspecified: Secondary | ICD-10-CM

## 2020-07-22 DIAGNOSIS — Z1329 Encounter for screening for other suspected endocrine disorder: Secondary | ICD-10-CM

## 2020-07-22 DIAGNOSIS — Z0000 Encounter for general adult medical examination without abnormal findings: Secondary | ICD-10-CM

## 2020-07-22 DIAGNOSIS — D72829 Elevated white blood cell count, unspecified: Secondary | ICD-10-CM | POA: Diagnosis not present

## 2020-07-22 DIAGNOSIS — Z23 Encounter for immunization: Secondary | ICD-10-CM | POA: Diagnosis not present

## 2020-07-22 DIAGNOSIS — R590 Localized enlarged lymph nodes: Secondary | ICD-10-CM | POA: Insufficient documentation

## 2020-07-22 DIAGNOSIS — E785 Hyperlipidemia, unspecified: Secondary | ICD-10-CM

## 2020-07-22 DIAGNOSIS — E559 Vitamin D deficiency, unspecified: Secondary | ICD-10-CM

## 2020-07-22 DIAGNOSIS — Z1389 Encounter for screening for other disorder: Secondary | ICD-10-CM

## 2020-07-22 LAB — CBC WITH DIFFERENTIAL/PLATELET
Basophils Absolute: 0.1 10*3/uL (ref 0.0–0.1)
Basophils Relative: 0.6 % (ref 0.0–3.0)
Eosinophils Absolute: 0.3 10*3/uL (ref 0.0–0.7)
Eosinophils Relative: 3.1 % (ref 0.0–5.0)
HCT: 38.1 % (ref 36.0–46.0)
Hemoglobin: 12.9 g/dL (ref 12.0–15.0)
Lymphocytes Relative: 32 % (ref 12.0–46.0)
Lymphs Abs: 3.6 10*3/uL (ref 0.7–4.0)
MCHC: 33.9 g/dL (ref 30.0–36.0)
MCV: 91.3 fl (ref 78.0–100.0)
Monocytes Absolute: 0.5 10*3/uL (ref 0.1–1.0)
Monocytes Relative: 4.1 % (ref 3.0–12.0)
Neutro Abs: 6.8 10*3/uL (ref 1.4–7.7)
Neutrophils Relative %: 60.2 % (ref 43.0–77.0)
Platelets: 371 10*3/uL (ref 150.0–400.0)
RBC: 4.17 Mil/uL (ref 3.87–5.11)
RDW: 12.4 % (ref 11.5–15.5)
WBC: 11.3 10*3/uL — ABNORMAL HIGH (ref 4.0–10.5)

## 2020-07-22 NOTE — Patient Instructions (Addendum)
Consider new hepatitis B vaccine   Add in more exercise 30 min most days of the week    Ref. Range 06/03/2017 11:48 06/25/2020 08:23  Cholesterol Latest Ref Range: 0 - 200 mg/dL 443 154 (H)  HDL Cholesterol Latest Ref Range: >39.00 mg/dL 00.86 76.19  LDL (calc) Latest Ref Range: 0 - 99 mg/dL 509 (H)   Direct LDL Latest Units: mg/dL  326.7  NonHDL Unknown 140.81 169.64  Triglycerides Latest Ref Range: 0.0 - 149.0 mg/dL 124.5 (H) 809.9 (H)  VLDL Latest Ref Range: 0.0 - 40.0 mg/dL 83.3 82.5 (H)    [facility]  [address]  [place] [address] [phone]   [facility]  [address]  [place] [address]  [phone] High Cholesterol  High cholesterol is a condition in which the blood has high levels of a white, waxy substance similar to fat (cholesterol). The liver makes all the cholesterol that the body needs. The human body needs small amounts of cholesterol to help build cells. A person gets extra or excess cholesterol from the food that he or she eats. The blood carries cholesterol from the liver to the rest of the body. If you have high cholesterol, deposits (plaques) may build up on the walls of your arteries. Arteries are the blood vessels that carry blood away from your heart. These plaques make the arteries narrow and stiff. Cholesterol plaques increase your risk for heart attack and stroke. Work with your health care provider to keep your cholesterol levels in a healthy range. What increases the risk? The following factors may make you more likely to develop this condition:  Eating foods that are high in animal fat (saturated fat) or cholesterol.  Being overweight.  Not getting enough exercise.  A family history of high cholesterol (familial hypercholesterolemia).  Use of tobacco products.  Having diabetes. What are the signs or symptoms? There are no symptoms of this  condition. How is this diagnosed? This condition may be diagnosed based on the results of a blood test.  If you are older than 29 years of age, your health care provider may check your cholesterol levels every 4-6 years.  You may be checked more often if you have high cholesterol or other risk factors for heart disease. The blood test for cholesterol measures:  "Bad" cholesterol, or LDL cholesterol. This is the main type of cholesterol that causes heart disease. The desired level is less than 100 mg/dL.  "Good" cholesterol, or HDL cholesterol. HDL helps protect against heart disease by cleaning the arteries and carrying the LDL to the liver for processing. The desired level for HDL is 60 mg/dL or higher.  Triglycerides. These are fats that your body can store or burn for energy. The desired level is less than 150 mg/dL.  Total cholesterol. This measures the total amount of cholesterol in your blood and includes LDL, HDL, and triglycerides. The desired level is less than 200 mg/dL. How is this treated? This condition may be treated with:  Diet changes. You may be asked to eat foods that have more fiber and less saturated fats or added sugar.  Lifestyle changes. These may include regular exercise, maintaining a healthy weight, and quitting use of tobacco products.  Medicines. These are given when diet and lifestyle changes have not worked. You may be prescribed a statin medicine to help lower your cholesterol levels. Follow these instructions at home: Eating and drinking  Eat a healthy, balanced diet. This diet includes: ? Daily servings of a variety of fresh, frozen, or canned fruits and vegetables. ? Daily servings of whole grain foods that are rich in fiber. ? Foods that are low in saturated fats and trans fats. These include poultry and fish without skin, lean cuts of meat, and low-fat dairy products. ? A variety of fish, especially oily fish that contain omega-3 fatty acids. Aim to  eat fish at least 2 times a week.  Avoid foods and drinks that have added sugar.  Use healthy cooking methods, such as roasting, grilling, broiling, baking, poaching, steaming, and stir-frying. Do not fry your food except for stir-frying.   Lifestyle  Get regular exercise. Aim to exercise for a total of 150 minutes a week. Increase your activity level by doing activities such as gardening, walking, and taking the stairs.  Do not use any products that contain nicotine or tobacco, such as cigarettes, e-cigarettes, and chewing tobacco. If you need help quitting, ask your health care provider.   General instructions  Take over-the-counter and prescription medicines only as told by your health care provider.  Keep all follow-up visits as told by your health care provider. This is important. Where to find more information  American Heart Association: www.heart.org  National Heart, Lung, and Blood Institute: PopSteam.is Contact a health care provider if:  You have trouble achieving or maintaining a healthy diet or weight.  You are starting an exercise program.  You are unable to stop smoking. Get help right away if:  You have chest pain.  You have trouble breathing.  You have any symptoms of a stroke. "BE FAST" is an easy way to remember the main warning signs of a stroke: ? B - Balance. Signs are dizziness, sudden trouble walking, or loss of balance. ? E - Eyes. Signs are trouble seeing or a sudden change in vision. ? F - Face. Signs are sudden weakness or numbness of the face, or the face or eyelid drooping on one side. ? A - Arms. Signs are weakness or numbness in an arm. This happens suddenly and usually on one side of the body. ? S - Speech. Signs are sudden trouble speaking, slurred speech, or trouble understanding what people say. ? T - Time. Time to call emergency services. Write down what time symptoms started.  You have other signs of a stroke, such as: ? A sudden,  severe headache with no known cause. ? Nausea or vomiting. ? Seizure. These symptoms may represent a serious problem that is an emergency. Do not wait to see if the symptoms will go away. Get medical help right away. Call your local emergency services (911 in the [place] Do not drive yourself to the hospital. Summary  Cholesterol plaques increase your risk for heart attack and stroke. Work with your health care provider to keep your cholesterol levels in a healthy range.  Eat a healthy, balanced diet, get regular exercise, and maintain a healthy weight.  Do not use any products that contain nicotine or tobacco, such as cigarettes, e-cigarettes, and chewing tobacco.  Get help right away if you have any symptoms of a stroke. This information is not intended to replace advice given to you by your health care provider. Make sure you discuss any questions you have with your health care provider. Document Revised: 03/26/2019 Document Reviewed: 03/26/2019 Elsevier Patient Education  2021 Elsevier Inc.  Cholesterol Content in Foods Cholesterol is a waxy, fat-like substance that helps to  carry fat in the blood. The body needs cholesterol in small amounts, but too much cholesterol can cause damage to the arteries and heart. Most people should eat less than 200 milligrams (mg) of cholesterol a day. Foods with cholesterol Cholesterol is found in animal-based foods, such as meat, seafood, and dairy. Generally, low-fat dairy and lean meats have less cholesterol than full-fat dairy and fatty meats. The milligrams of cholesterol per serving (mg per serving) of common cholesterol-containing foods are listed below. Meat and other proteins  Egg - one large whole egg has 186 mg.  Veal shank - 4 oz has 141 mg.  Lean ground [place] (93% lean) - 4 oz has 118 mg.  Fat-trimmed lamb loin - 4 oz has 106 mg.  Lean ground beef (90% lean) - 4 oz has 100 mg.  Lobster - 3.5 oz has 90 mg.  Pork loin chops - 4 oz  has 86 mg.  Canned salmon - 3.5 oz has 83 mg.  Fat-trimmed beef top loin - 4 oz has 78 mg.  Frankfurter - 1 frank (3.5 oz) has 77 mg.  Crab - 3.5 oz has 71 mg.  Roasted chicken without skin, white meat - 4 oz has 66 mg.  Light bologna - 2 oz has 45 mg.  Deli-cut [place] - 2 oz has 31 mg.  Canned tuna - 3.5 oz has 31 mg.  Bacon - 1 oz has 29 mg.  Oysters and mussels (raw) - 3.5 oz has 25 mg.  Mackerel - 1 oz has 22 mg.  Trout - 1 oz has 20 mg.  Pork sausage - 1 link (1 oz) has 17 mg.  Salmon - 1 oz has 16 mg.  Tilapia - 1 oz has 14 mg. Dairy  Soft-serve ice cream -  cup (4 oz) has 103 mg.  Whole-milk yogurt - 1 cup (8 oz) has 29 mg.  Cheddar cheese - 1 oz has 28 mg.  American cheese - 1 oz has 28 mg.  Whole milk - 1 cup (8 oz) has 23 mg.  2% milk - 1 cup (8 oz) has 18 mg.  Cream cheese - 1 tablespoon (Tbsp) has 15 mg.  Cottage cheese -  cup (4 oz) has 14 mg.  Low-fat (1%) milk - 1 cup (8 oz) has 10 mg.  Sour cream - 1 Tbsp has 8.5 mg.  Low-fat yogurt - 1 cup (8 oz) has 8 mg.  Nonfat Greek yogurt - 1 cup (8 oz) has 7 mg.  Half-and-half cream - 1 Tbsp has 5 mg. Fats and oils  Cod liver oil - 1 tablespoon (Tbsp) has 82 mg.  Butter - 1 Tbsp has 15 mg.  Lard - 1 Tbsp has 14 mg.  Bacon grease - 1 Tbsp has 14 mg.  Mayonnaise - 1 Tbsp has 5-10 mg.  Margarine - 1 Tbsp has 3-10 mg. Exact amounts of cholesterol in these foods may vary depending on specific ingredients and brands.   Foods without cholesterol Most plant-based foods do not have cholesterol unless you combine them with a food that has cholesterol. Foods without cholesterol include:  Grains and cereals.  Vegetables.  Fruits.  Vegetable oils, such as olive, canola, and sunflower oil.  Legumes, such as peas, beans, and lentils.  Nuts and seeds.  Egg whites.   Summary  The body needs cholesterol in small amounts, but too much cholesterol can cause damage to the arteries and  heart.  Most people should eat less than 200 milligrams (mg)  of cholesterol a day. This information is not intended to replace advice given to you by your health care provider. Make sure you discuss any questions you have with your health care provider. Document Revised: 09/17/2019 Document Reviewed: 09/17/2019 Elsevier Patient Education  2021 Elsevier Inc.   Tdap (Tetanus, Diphtheria, Pertussis) Vaccine: What You Need to Know 1. Why get vaccinated? Tdap vaccine can prevent tetanus, diphtheria, and pertussis. Diphtheria and pertussis spread from person to person. Tetanus enters the body through cuts or wounds.  TETANUS (T) causes painful stiffening of the muscles. Tetanus can lead to serious health problems, including being unable to open the mouth, having trouble swallowing and breathing, or death.  DIPHTHERIA (D) can lead to difficulty breathing, heart failure, paralysis, or death.  PERTUSSIS (aP), also known as "whooping cough," can cause uncontrollable, violent coughing that makes it hard to breathe, eat, or drink. Pertussis can be extremely serious especially in babies and young children, causing pneumonia, convulsions, brain damage, or death. In teens and adults, it can cause weight loss, loss of bladder control, passing out, and rib fractures from severe coughing. 2. Tdap vaccine Tdap is only for children 7 years and older, adolescents, and adults.  Adolescents should receive a single dose of Tdap, preferably at age 29 or 12 years. Pregnant people should get a dose of Tdap during every pregnancy, preferably during the early part of the third trimester, to help protect the newborn from pertussis. Infants are most at risk for severe, life-threatening complications from pertussis. Adults who have never received Tdap should get a dose of Tdap. Also, adults should receive a booster dose of either Tdap or Td (a different vaccine that protects against tetanus and diphtheria but not pertussis)  every 10 years, or after 5 years in the case of a severe or dirty wound or burn. Tdap may be given at the same time as other vaccines. 3. Talk with your health care provider Tell your vaccine provider if the person getting the vaccine:  Has had an allergic reaction after a previous dose of any vaccine that protects against tetanus, diphtheria, or pertussis, or has any severe, life-threatening allergies  Has had a coma, decreased level of consciousness, or prolonged seizures within 7 days after a previous dose of any pertussis vaccine (DTP, DTaP, or Tdap)  Has seizures or another nervous system problem  Has ever had Guillain-Barr Syndrome (also called "GBS")  Has had severe pain or swelling after a previous dose of any vaccine that protects against tetanus or diphtheria In some cases, your health care provider may decide to postpone Tdap vaccination until a future visit. People with minor illnesses, such as a cold, may be vaccinated. People who are moderately or severely ill should usually wait until they recover before getting Tdap vaccine.  Your health care provider can give you more information. 4. Risks of a vaccine reaction  Pain, redness, or swelling where the shot was given, mild fever, headache, feeling tired, and nausea, vomiting, diarrhea, or stomachache sometimes happen after Tdap vaccination. People sometimes faint after medical procedures, including vaccination. Tell your provider if you feel dizzy or have vision changes or ringing in the ears.  As with any medicine, there is a very remote chance of a vaccine causing a severe allergic reaction, other serious injury, or death. 5. What if there is a serious problem? An allergic reaction could occur after the vaccinated person leaves the clinic. If you see signs of a severe allergic reaction (hives, swelling of the  face and throat, difficulty breathing, a fast heartbeat, dizziness, or weakness), call 9-1-1 and get the person to the  nearest hospital. For other signs that concern you, call your health care provider.  Adverse reactions should be reported to the Vaccine Adverse Event Reporting System (VAERS). Your health care provider will usually file this report, or you can do it yourself. Visit the VAERS website at www.vaers.LAgents.no or call 252 133 9105. VAERS is only for reporting reactions, and VAERS staff members do not give medical advice. 6. The National Vaccine Injury Compensation Program The [employer] Vaccine Injury Compensation Program (VICP) is a federal program that was created to compensate people who may have been injured by certain vaccines. Claims regarding alleged injury or death due to vaccination have a time limit for filing, which may be as short as two years. Visit the VICP website at SpiritualWord.at or call 682-846-6633 to learn about the program and about filing a claim. 7. How can I learn more?  Ask your health care provider.  Call your local or state health department.  Visit the website of the Food and Drug Administration (FDA) for vaccine package inserts and additional information at FinderList.no.  Contact the Centers for Disease Control and Prevention (CDC): ? Call 236-422-9477 (1-800-CDC-INFO) or ? Visit CDC's website at PicCapture.uy. Vaccine Information Statement Tdap (Tetanus, Diphtheria, Pertussis) Vaccine (12/14/2019) This information is not intended to replace advice given to you by your health care provider. Make sure you discuss any questions you have with your health care provider. Document Revised: 01/09/2020 Document Reviewed: 01/09/2020 Elsevier Patient Education  2021 [employer].

## 2020-07-22 NOTE — Addendum Note (Signed)
Addended by: Sandy Salaam on: 07/22/2020 09:19 AM   Modules accepted: Orders
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
## 2020-07-25 ENCOUNTER — Telehealth: Payer: Self-pay | Admitting: Internal Medicine

## 2020-07-25 NOTE — Telephone Encounter (Signed)
Left message to return call. Patient needing a lab appointment in one month for CBC only.

## 2021-03-02 ENCOUNTER — Telehealth: Payer: Self-pay | Admitting: Internal Medicine

## 2021-03-02 ENCOUNTER — Other Ambulatory Visit: Payer: Self-pay | Admitting: Internal Medicine

## 2021-03-02 DIAGNOSIS — F419 Anxiety disorder, unspecified: Secondary | ICD-10-CM

## 2021-03-02 MED ORDER — LORAZEPAM 0.5 MG PO TABS: 0.5000 mg | ORAL_TABLET | Freq: Every day | ORAL | 2 refills | Status: DC | PRN

## 2021-03-02 NOTE — Telephone Encounter (Signed)
Patient informed, Due to the high volume of calls and your symptoms we have to forward your call to our Triage Nurse to expedient your call. Please hold for the transfer.  Patient transferred to Access Nurse. Due to having increased anxiety and trouble breathing the past few days.She was checked out by a friend who is a Freight forwarder who said that she is having increased anxiety.She would like to know if she can be prescribed Adavan for this,no openings in office or virtual for the patient to be evaluated.

## 2021-03-02 NOTE — Telephone Encounter (Signed)
She needs to reach out to her therapist if does not have one can call [facility] and schedule appt in [place] or [place] location  Refills as needed until appt 07/2021  If needs sooner appt schedule for anxiety she may need psychiatry referral

## 2021-03-02 NOTE — Telephone Encounter (Signed)
Called to speak with Christina Acevedo. She states that she has been having increased anxiety and has had a panic attack 2 times previously before yesterday, 03/01/21. Pt states that she was evaluated at home by a nurse practitioner friend and they informed her that it was anxiety and a panic attack. Pt states that she was having slight SOB with a flight response and that she felt that she was "Running away from something". Pt states that she has had a recent change in work load and is now working 60 hours a week. Checking PCP schedule to provide a new medication, soonest availability was in December. Pt declined an appointment and states that she was given Ativan years ago by Dr. French Ana and she requests the same prescription. Pt was informed that a new appointment is necessary to be given a new medication, even if it was given previously as she is requesting a controlled medication. Pt asks for advice as she does not feel like she can wait until December due to her increase of anxiety. Please advise.

## 2021-03-03 NOTE — Telephone Encounter (Signed)
Providing access nurse documentation.      

## 2021-03-03 NOTE — Telephone Encounter (Signed)
Called the Patient and informed of the below. She has picked up the Ativan.

## 2021-03-19 ENCOUNTER — Other Ambulatory Visit
Admission: RE | Admit: 2021-03-19 | Discharge: 2021-03-19 | Disposition: A | Discharge status: Home / Self Care | Payer: Managed Care, Other (non HMO) | Source: Ambulatory Visit | Attending: Student | Admitting: Student

## 2021-03-19 DIAGNOSIS — R0602 Shortness of breath: Secondary | ICD-10-CM | POA: Insufficient documentation

## 2021-03-19 LAB — D-DIMER, QUANTITATIVE: D-Dimer, Quant: 0.37 ug/mL-FEU (ref 0.00–0.50)

## 2021-05-06 IMAGING — US US SOFT TISSUE HEAD/NECK
1 series · 13 of 13 positions shown · non-contrast
Comparison: None.

CLINICAL DATA: Lymphadenopathy.

EXAM:
ULTRASOUND OF HEAD/NECK SOFT TISSUES
TECHNIQUE: Ultrasound examination of the head and neck soft tissues was
performed in the area of clinical concern.

[Series 1: us soft tissue head/neck · 0.07mm/px · 13 of 13 slices shown]
[im 1/13]
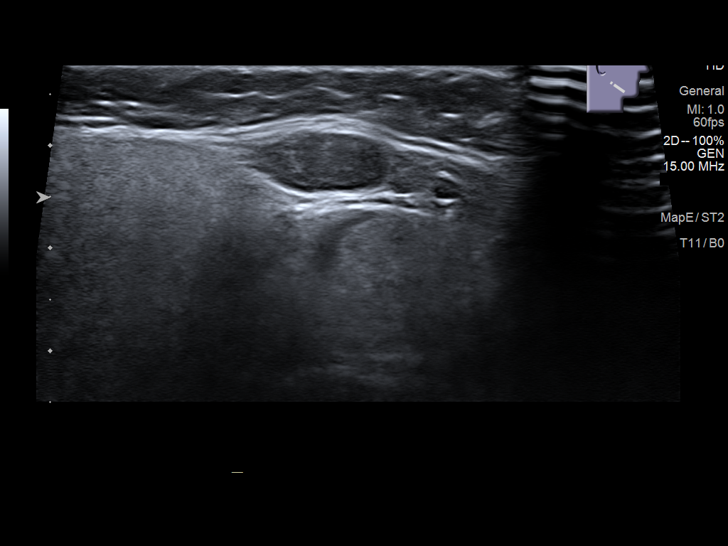
[im 2/13]
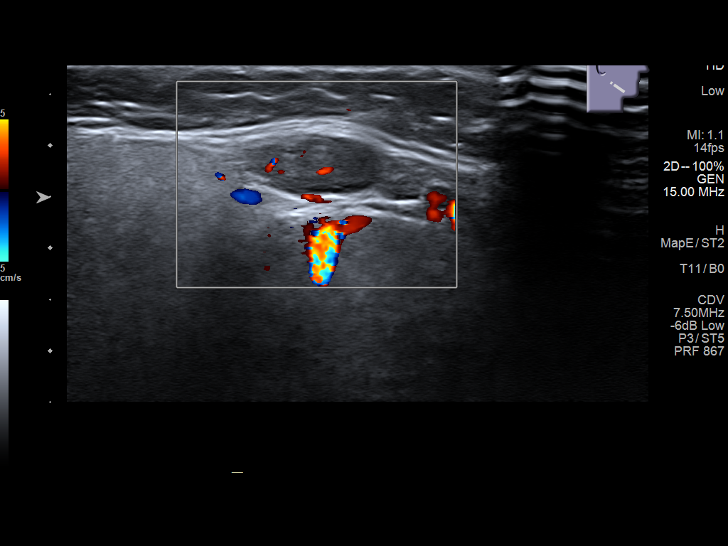
[im 3/13]
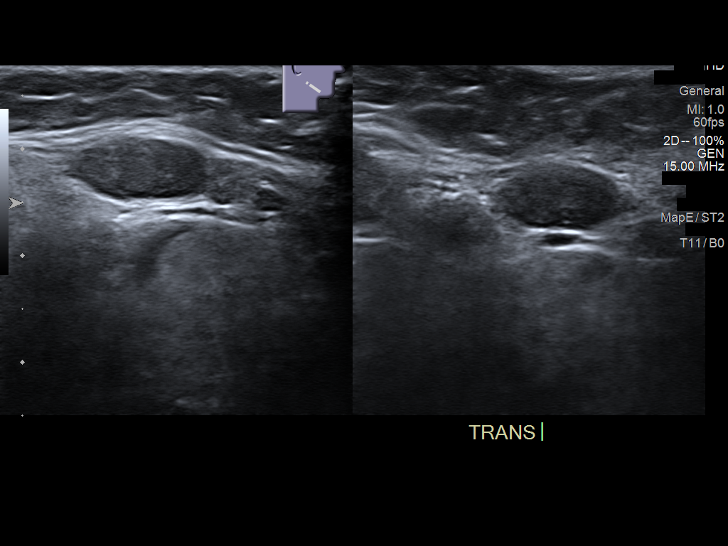
[im 4/13]
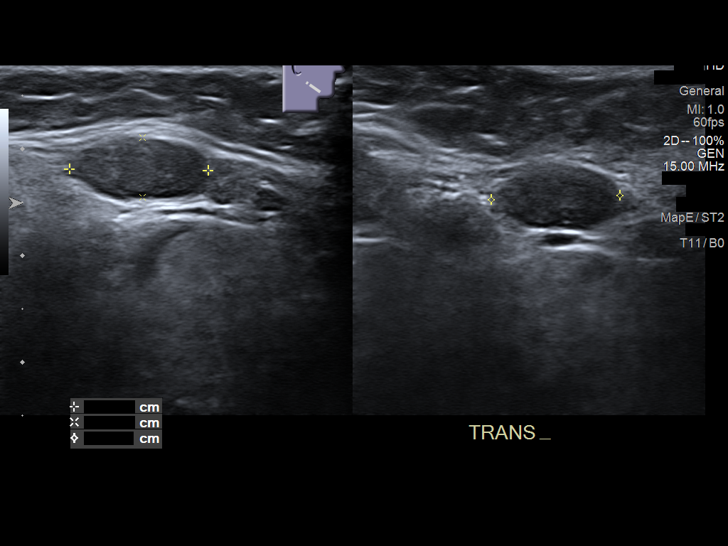
[im 5/13]
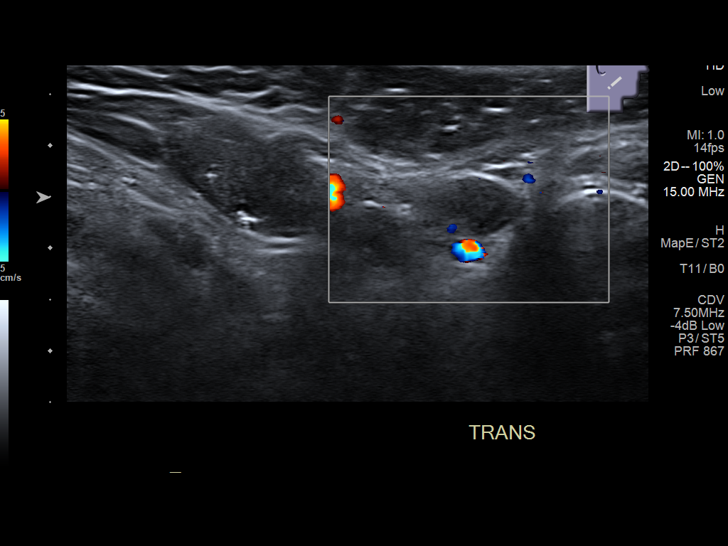
[im 6/13]
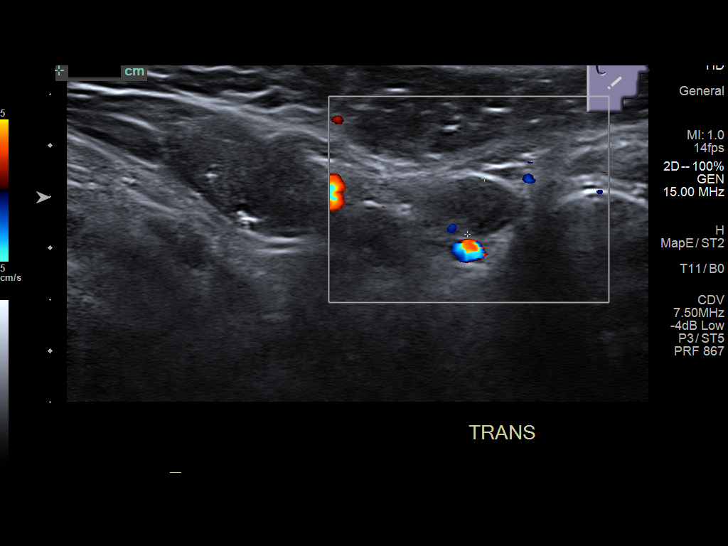
[im 7/13]
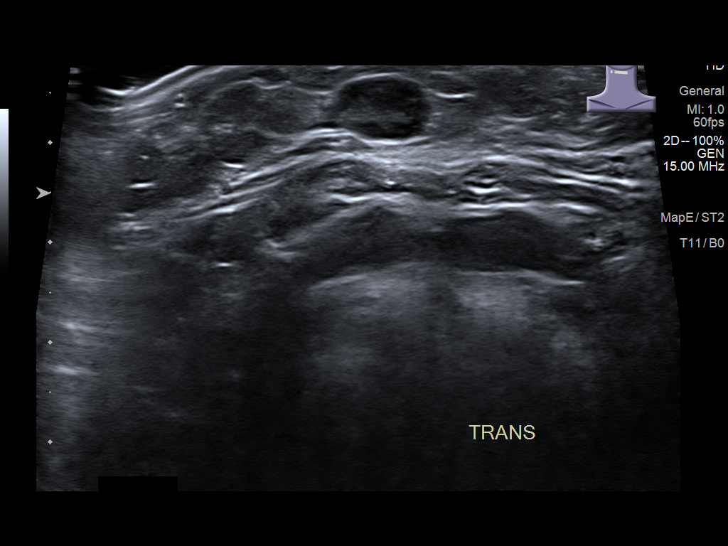
[im 8/13]
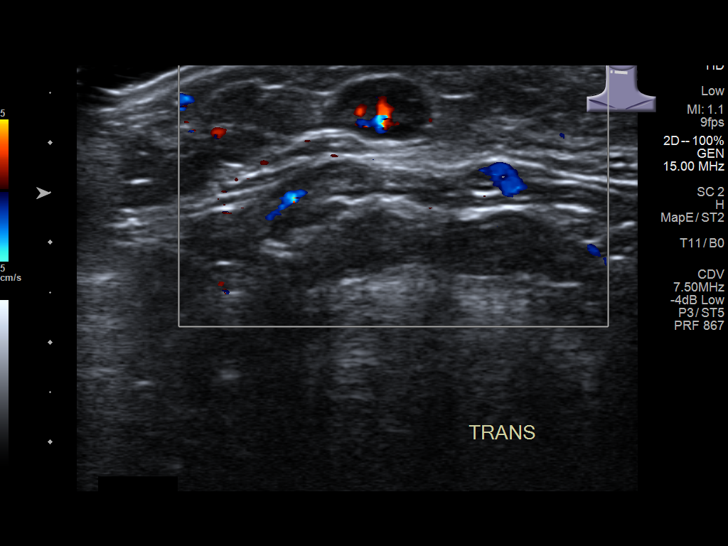
[im 9/13]
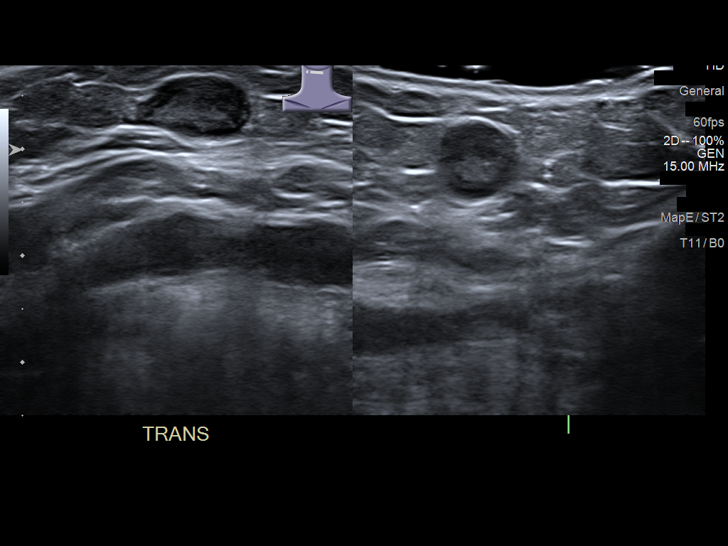
[im 10/13]
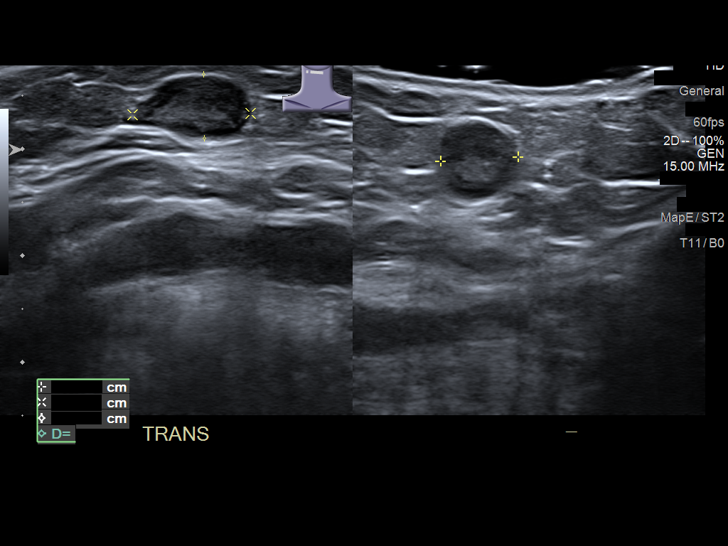
[im 11/13]
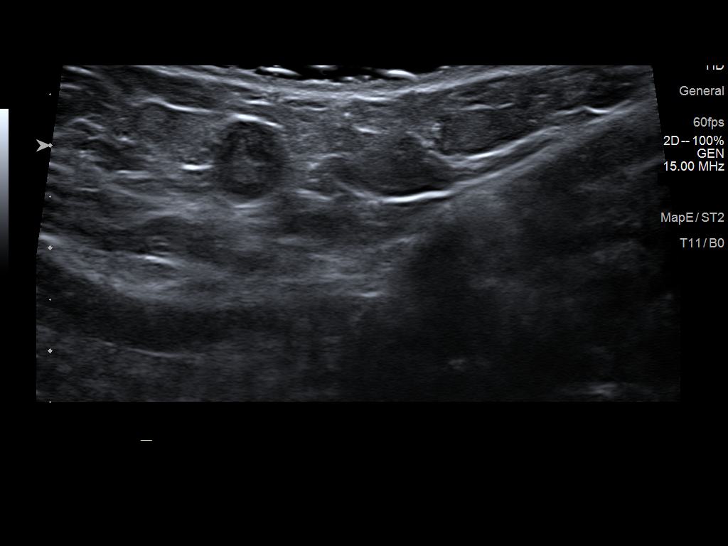
[im 12/13]
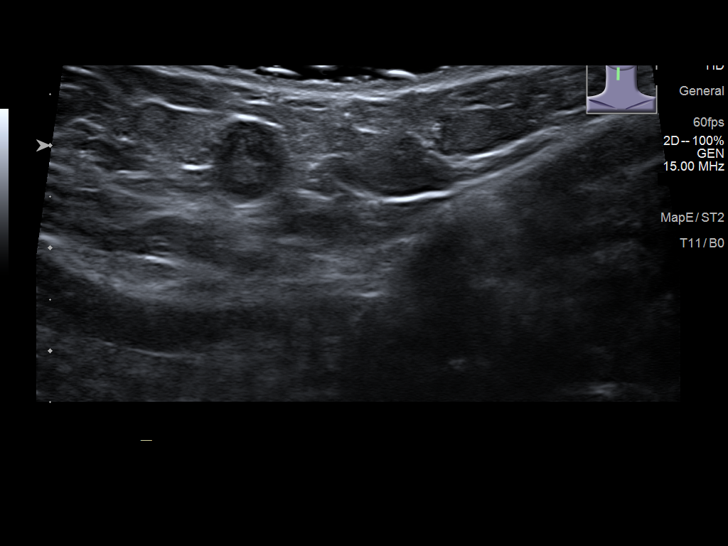
[im 13/13]
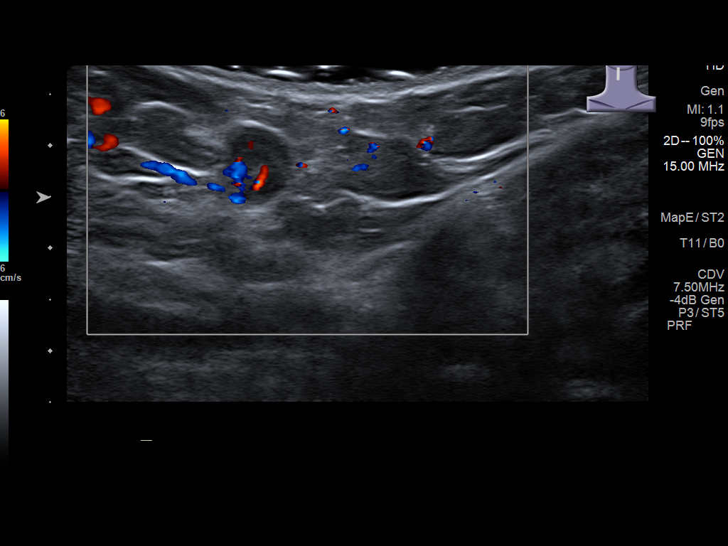

[13 of 13 positions shown; findings below may reference images not displayed]

FINDINGS: A 7 x 11 x 6 mm nodule in the right submental region demonstrates
hilar vascularity and central echogenicity consistent with normal
lymph node morphology. A similar hypoechoic nodule more laterally
and posteriorly in the right submandibular region measures 13 x 6 x
12 mm and also likely reflects a lymph node. No cystic changes are
present.
IMPRESSION: Small right submandibular and submental lymph nodes without
suspicious morphology.

## 2021-07-24 ENCOUNTER — Encounter: Payer: Managed Care, Other (non HMO) | Admitting: Internal Medicine

## 2022-04-30 ENCOUNTER — Other Ambulatory Visit: Payer: Self-pay | Admitting: Neurology

## 2022-04-30 DIAGNOSIS — G43719 Chronic migraine without aura, intractable, without status migrainosus: Secondary | ICD-10-CM

## 2022-05-19 ENCOUNTER — Other Ambulatory Visit: Payer: Self-pay

## 2022-05-19 DIAGNOSIS — G43919 Migraine, unspecified, intractable, without status migrainosus: Secondary | ICD-10-CM

## 2022-05-28 ENCOUNTER — Ambulatory Visit
Admission: RE | Admit: 2022-05-28 | Discharge: 2022-05-28 | Disposition: A | Discharge status: Home / Self Care | Payer: Managed Care, Other (non HMO) | Source: Ambulatory Visit | Attending: Neurology | Admitting: Neurology

## 2022-05-28 DIAGNOSIS — G43919 Migraine, unspecified, intractable, without status migrainosus: Secondary | ICD-10-CM

## 2022-05-30 ENCOUNTER — Other Ambulatory Visit: Payer: Managed Care, Other (non HMO)

## 2022-06-11 ENCOUNTER — Other Ambulatory Visit: Payer: Self-pay | Admitting: Student

## 2022-06-11 DIAGNOSIS — G43719 Chronic migraine without aura, intractable, without status migrainosus: Secondary | ICD-10-CM

## 2022-12-02 ENCOUNTER — Ambulatory Visit
Admission: RE | Admit: 2022-12-02 | Discharge: 2022-12-02 | Disposition: A | Discharge status: Home / Self Care | Payer: Managed Care, Other (non HMO) | Source: Ambulatory Visit | Attending: Student | Admitting: Student

## 2022-12-02 DIAGNOSIS — G43719 Chronic migraine without aura, intractable, without status migrainosus: Secondary | ICD-10-CM

## 2022-12-02 MED ORDER — GADOPICLENOL 0.5 MMOL/ML IV SOLN
7.5000 mL | Freq: Once | INTRAVENOUS | Status: AC | PRN
  Administered 2022-12-02: 7.5 mL via INTRAVENOUS

## 2023-04-06 ENCOUNTER — Other Ambulatory Visit: Payer: Self-pay

## 2023-04-06 ENCOUNTER — Ambulatory Visit
Admission: EM | Admit: 2023-04-06 | Discharge: 2023-04-06 | Disposition: A | Discharge status: Home / Self Care | Payer: Managed Care, Other (non HMO) | Attending: Family Medicine | Admitting: Family Medicine

## 2023-04-06 ENCOUNTER — Encounter: Payer: Self-pay | Admitting: Family Medicine

## 2023-04-06 DIAGNOSIS — N9089 Other specified noninflammatory disorders of vulva and perineum: Secondary | ICD-10-CM | POA: Diagnosis not present

## 2023-04-06 MED ORDER — DOXYCYCLINE HYCLATE 100 MG PO CAPS: 100.0000 mg | ORAL_CAPSULE | Freq: Two times a day (BID) | ORAL | 0 refills | Status: DC

## 2023-04-06 MED ORDER — FLUCONAZOLE 150 MG PO TABS: 150.0000 mg | ORAL_TABLET | Freq: Once | ORAL | 0 refills | Status: AC

## 2023-04-06 NOTE — Discharge Instructions (Addendum)
Stop by the pharmacy to pick up your prescriptions.  Follow up with your gynecologist, if symptoms don't improve.

## 2023-04-06 NOTE — ED Provider Notes (Signed)
[facility]    CSN: [id number] Arrival date & time: 04/06/23  1603      History   Chief Complaint Chief Complaint  Patient presents with   Bartholin's Cyst     HPI HPI Christina Acevedo is a 32 y.o. female.    Christina Acevedo presents for right labial pain and swelling that started a couple weeks ago while in [place].  Symptoms went away but returned this week. The area is red and swollen and it looks like there is under the skin. She has HSV but is not having an outbreak. Takes Valtrex daily. She shaves. She has PCOS and her pubic hair has gotten thicker after stopping birth control.          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    HSV (herpes simplex virus) infection    1/2 as of 02/2020   IBS (irritable bowel syndrome)    mixed   UTI (urinary tract infection)    UTI (urinary tract infection)    E coli     Patient Active Problem List   Diagnosis Date Noted   Submental lymphadenopathy 07/22/2020   Submandibular lymphadenopathy 07/22/2020   Annual physical exam 07/22/2020   Hyperlipidemia 07/22/2020   Obesity (BMI 30-39.9) 05/28/2020   Gastritis 10/31/2017   Enteritis 10/31/2017   Dyspareunia in female 07/01/2017   Hemochromatosis 06/10/2017   Migraine 06/10/2017   Hemochromatosis carrier 06/07/2017   Diarrhea 06/07/2017   Constipation 06/07/2017   Menstrual irregularity 06/07/2017   Insomnia 06/07/2017   Lower abdominal pain 06/07/2017   Anxiety 06/07/2017   Irritable bowel syndrome with both constipation and diarrhea 06/07/2017    Past Surgical History:  Procedure Laterality Date   COLONOSCOPY WITH PROPOFOL N/A 07/04/2017   Procedure: COLONOSCOPY WITH PROPOFOL;  Surgeon: Toney Reil, MD;  Location: [facility] ENDOSCOPY;  Service: Gastroenterology;  Laterality: N/A;    ESOPHAGOGASTRODUODENOSCOPY (EGD) WITH PROPOFOL N/A 07/04/2017   Procedure: ESOPHAGOGASTRODUODENOSCOPY (EGD) WITH PROPOFOL;  Surgeon: Toney Reil, MD;  Location: [facility] ENDOSCOPY;  Service: Gastroenterology;  Laterality: N/A;   TONSILLECTOMY     2008/2009   Wisdom Teeth Removal      OB History   No obstetric history on file.      Home Medications    Prior to Admission medications   Medication Sig Start Date End Date Taking? Authorizing Provider  doxycycline (VIBRAMYCIN) 100 MG capsule Take 1 capsule (100 mg total) by mouth 2 (two) times daily. 04/06/23  Yes Amandine Covino, DO  fluconazole (DIFLUCAN) 150 MG tablet Take 1 tablet (150 mg total) by mouth once for 1 dose. 04/06/23 04/06/23 Yes Zimri Brennen, DO  Ascorbic Acid (VITAMIN C) 1000 MG tablet Take 1,000 mg by mouth daily.    [provider]  Aspirin-Acetaminophen-Caffeine (EXCEDRIN MIGRAINE PO) Take by mouth.    [provider]  cholecalciferol (VITAMIN D3) 25 MCG (1000 UNIT) tablet Take 1,000 Units by mouth daily.    [provider]  LORazepam (ATIVAN) 0.5 MG tablet Take 1 tablet (0.5 mg total) by mouth daily as needed for anxiety. 03/02/21   McLean-Scocuzza, Pasty Spillers, MD  Omega-3 Fatty Acids (FISH OIL PO) Take by mouth.    [provider]  omeprazole (PRILOSEC OTC) 20 MG tablet Take 20 mg  by mouth daily.    [provider]  Prenatal Vit-Fe Fumarate-FA (PRENATAL VITAMIN PO) Take 1 tablet by mouth daily.    [provider]  SPRINTEC 28 0.25-35 MG-MCG tablet TAKE 1 ACTIVE PILL EVERYDAY FOR 3 MONTHS 03/21/17   [provider]  valACYclovir (VALTREX) 1000 MG tablet Take 1,000 mg by mouth daily.    [provider]    Family History Family History  Problem Relation Age of Onset   Cancer Mother        basal cell    Hyperlipidemia Mother        on meds   Miscarriages / Stillbirths Mother    Hemachromatosis Mother    Endometriosis Mother     Hyperlipidemia Father    Infertility Sister        IUI as of 07/22/20    Cancer Maternal Grandmother        breast nipple   Hyperlipidemia Maternal Grandmother    Heart disease Maternal Grandfather    Hyperlipidemia Maternal Grandfather    Hypertension Maternal Grandfather    Kidney disease Maternal Grandfather    Dementia Maternal Grandfather    Cancer Paternal Grandmother        colon died age 29    Social History Social History   Tobacco Use   Smoking status: Former    Types: Cigarettes   Smokeless tobacco: Never  Vaping Use   Vaping status: Every Day  Substance Use Topics   Alcohol use: Yes    Alcohol/week: 1.0 standard drink of alcohol    Types: 1 Glasses of wine per week   Drug use: No     Allergies   Patient has no known allergies.   Review of Systems Review of Systems: :negative unless otherwise stated in HPI.      Physical Exam Triage Vital Signs ED Triage Vitals  Encounter Vitals Group     BP 04/06/23 1616 127/88     Systolic BP Percentile --      Diastolic BP Percentile --      Pulse Rate 04/06/23 1616 85     Resp 04/06/23 1616 20     Temp 04/06/23 1616 98.1 F (36.7 C)     Temp src --      SpO2 04/06/23 1616 100 %     Weight --      Height --      Head Circumference --      Peak Flow --      Pain Score 04/06/23 1613 3     Pain Loc --      Pain Education --      Exclude from Growth Chart --    No data found.  Updated Vital Signs BP 127/88   Pulse 85   Temp 98.1 F (36.7 C)   Resp 20   SpO2 100%   Visual Acuity Right Eye Distance:   Left Eye Distance:   Bilateral Distance:    Right Eye Near:   Left Eye Near:    Bilateral Near:     Physical Exam GEN: well appearing female in no acute distress  CVS: well perfused  RESP: speaking in full sentences without pause  GU: Pelvic exam: internal exam deferred,  right labial minora with tender small cyst with small amount of bleeding but no discharge, exam chaperoned by CMA Tiffany.     UC Treatments / Results  Labs (all labs ordered are listed, but only abnormal results are displayed) Labs Reviewed -  No data to display  EKG   Radiology No results found.  Procedures Procedures (including critical care time)  Medications Ordered in UC Medications - No data to display  Initial Impression / Assessment and Plan / UC Course  I have reviewed the triage vital signs and the nursing notes.  Pertinent labs & imaging results that were available during my care of the patient were reviewed by me and considered in my medical decision making (see chart for details).      Patient is a 32 y.o.Marland Kitchen female  who presents for vaginal concern.   Overall patient is well-appearing and afebrile.  Vital signs stable. On exam, she has a tender small (<0.5 cm) cyst vs developing abscess. Doubt HSV or RPR outbreak.  Treat possible forming abscess with doxycycline as below. Has history of antibiotic associated yeast infection.  This could possibly be a mucous cyst or vesicular gland cyst.  If pain does not improve with doxycycline, she is to follow-up with her gynecologist.  Follow-up,  if symptoms not improving or getting worse. Discussed MDM, treatment plan and plan for follow-up with patient who agrees with plan.        Final Clinical Impressions(s) / UC Diagnoses   Final diagnoses:  Lesion of labia     Discharge Instructions      Stop by the pharmacy to pick up your prescriptions.  Follow up with your gynecologist, if symptoms don't improve.      ED Prescriptions     Medication Sig Dispense Auth. Provider   doxycycline (VIBRAMYCIN) 100 MG capsule Take 1 capsule (100 mg total) by mouth 2 (two) times daily. 20 capsule Trea Carnegie, DO   fluconazole (DIFLUCAN) 150 MG tablet Take 1 tablet (150 mg total) by mouth once for 1 dose. 1 tablet Katha Cabal, DO      PDMP not reviewed this encounter.   Katha Cabal, DO 04/06/23 1715

## 2023-04-06 NOTE — ED Triage Notes (Signed)
Pt reports swelling to RT labia and possible bartholin cyst.

## 2023-08-28 ENCOUNTER — Emergency Department: Admission: EM | Admit: 2023-08-28 | Discharge: 2023-08-28 | Disposition: A | Discharge status: Home / Self Care

## 2023-08-28 ENCOUNTER — Emergency Department

## 2023-08-28 ENCOUNTER — Other Ambulatory Visit: Payer: Self-pay

## 2023-08-28 DIAGNOSIS — R109 Unspecified abdominal pain: Secondary | ICD-10-CM

## 2023-08-28 DIAGNOSIS — R1011 Right upper quadrant pain: Secondary | ICD-10-CM | POA: Insufficient documentation

## 2023-08-28 HISTORY — DX: Polycystic ovarian syndrome: E28.2

## 2023-08-28 HISTORY — DX: Pure hypercholesterolemia, unspecified: E78.00

## 2023-08-28 LAB — CBC
HCT: 39.2 % (ref 36.0–46.0)
Hemoglobin: 13.8 g/dL (ref 12.0–15.0)
MCH: 31.9 pg (ref 26.0–34.0)
MCHC: 35.2 g/dL (ref 30.0–36.0)
MCV: 90.7 fL (ref 80.0–100.0)
Platelets: 275 10*3/uL (ref 150–400)
RBC: 4.32 MIL/uL (ref 3.87–5.11)
RDW: 11.5 % (ref 11.5–15.5)
WBC: 8.4 10*3/uL (ref 4.0–10.5)
nRBC: 0 % (ref 0.0–0.2)

## 2023-08-28 LAB — URINALYSIS, ROUTINE W REFLEX MICROSCOPIC
Bilirubin Urine: NEGATIVE
Glucose, UA: NEGATIVE mg/dL
Ketones, ur: 20 mg/dL — AB
Leukocytes,Ua: NEGATIVE
Nitrite: NEGATIVE
Protein, ur: NEGATIVE mg/dL
Specific Gravity, Urine: 1.02 (ref 1.005–1.030)
pH: 5 (ref 5.0–8.0)

## 2023-08-28 LAB — COMPREHENSIVE METABOLIC PANEL WITH GFR
ALT: 19 U/L (ref 0–44)
AST: 18 U/L (ref 15–41)
Albumin: 4.5 g/dL (ref 3.5–5.0)
Alkaline Phosphatase: 50 U/L (ref 38–126)
Anion gap: 9 (ref 5–15)
BUN: 10 mg/dL (ref 6–20)
CO2: 21 mmol/L — ABNORMAL LOW (ref 22–32)
Calcium: 9.1 mg/dL (ref 8.9–10.3)
Chloride: 107 mmol/L (ref 98–111)
Creatinine, Ser: 0.7 mg/dL (ref 0.44–1.00)
GFR, Estimated: >60 mL/min (ref >60)
Glucose, Bld: 95 mg/dL (ref 70–99)
Potassium: 4 mmol/L (ref 3.5–5.1)
Sodium: 137 mmol/L (ref 135–145)
Total Bilirubin: 0.9 mg/dL (ref 0.0–1.2)
Total Protein: 7.3 g/dL (ref 6.5–8.1)

## 2023-08-28 LAB — LIPASE, BLOOD: Lipase: 44 U/L (ref 11–51)

## 2023-08-28 LAB — HCG, QUANTITATIVE, PREGNANCY: hCG, Beta Chain, Quant, S: <1 m[IU]/mL (ref <5)

## 2023-08-28 MED ORDER — ACETAMINOPHEN 500 MG PO TABS
1000.0000 mg | ORAL_TABLET | Freq: Once | ORAL | Status: AC
  Administered 2023-08-28: 1000 mg via ORAL
  Filled 2023-08-28: qty 2

## 2023-08-28 MED ORDER — CYCLOBENZAPRINE HCL 5 MG PO TABS: 5.0000 mg | ORAL_TABLET | Freq: Three times a day (TID) | ORAL | 0 refills | Status: AC | PRN

## 2023-08-28 MED ORDER — LIDOCAINE 5 % EX PTCH
1.0000 | MEDICATED_PATCH | CUTANEOUS | Status: DC
  Administered 2023-08-28: 1 via TRANSDERMAL
  Filled 2023-08-28: qty 1

## 2023-08-28 MED ORDER — LIDOCAINE 5 % EX PTCH: 1.0000 | MEDICATED_PATCH | CUTANEOUS | 0 refills | Status: AC

## 2023-08-28 NOTE — ED Provider Notes (Signed)
 [facility] Provider Note    Event Date/Time   First MD Initiated Contact with Patient 08/28/23 1319     (approximate)   History   Flank Pain  Pt arrives via POV with c/o right sided flank pain that started this morning around 0500. Pt states that they are on the period and they have been experiencing abdominal pain with their periods since December but this is different from those pains. Pt denies any pain with urination. Pt normally experiences N/V/D which started on Tuesday so they are not sure if this is any different. Pt states that they are having a procedure on 5/8 to officially Dx endometriosis. Pt has a Hx of PCOS and is taking a semiglutide for weight control.   HPI Christina Acevedo is a 33 y.o. female self-reported history of endometriosis (no records available for chart review at time of initial eval) presents for evaluation of right flank pain - Currently on her menstrual cycle, generally gets pelvic discomfort on menstrual cycle however flank and upper abdominal discomfort is abnormal.  Has been having right upper flank pain since awaking this morning, took 600 mg ibuprofen around 11 AM with minimal relief.  Also has some radiation of pain into right upper quadrant.  Did have diarrhea within the past day, no black or bloody stools.  No vomiting.  Pain was not immediately following food intake. - No fevers -No urinary symptoms      Physical Exam   Triage Vital Signs: ED Triage Vitals  Encounter Vitals Group     BP 08/28/23 1258 (!) 124/90     Systolic BP Percentile --      Diastolic BP Percentile --      Pulse Rate 08/28/23 1258 (!) 114     Resp 08/28/23 1258 17     Temp 08/28/23 1258 98.3 F (36.8 C)     Temp Source 08/28/23 1258 Oral     SpO2 08/28/23 1258 100 %     Weight 08/28/23 1313 145 lb (65.8 kg)     Height 08/28/23 1313 5\' 2"  (1.575 m)     Head Circumference --      Peak Flow --      Pain Score 08/28/23 1303 5     Pain Loc --       Pain Education --      Exclude from Growth Chart --     Most recent vital signs: Vitals:   08/28/23 1430 08/28/23 1542  BP: 109/74 98/67  Pulse: 93 80  Resp: 14 14  Temp:  98.5 F (36.9 C)  SpO2: 100% 100%     General: Awake, no distress nontoxic appearing but does look uncomfortable CV:  Good peripheral perfusion. RRR, RP 2+ Resp:  Normal effort. CTAB Abd:  No distention.  Mild tenderness palpation right upper quadrant .  Trace tenderness in left lower quadrant which patient says is typical for her at baseline.  No tenderness elsewhere. + Point tenderness in right upper flank.    ED Results / Procedures / Treatments   Labs (all labs ordered are listed, but only abnormal results are displayed) Labs Reviewed  COMPREHENSIVE METABOLIC PANEL WITH GFR - Abnormal; Notable for the following components:      Result Value   CO2 21 (*)    All other components within normal limits  URINALYSIS, ROUTINE W REFLEX MICROSCOPIC - Abnormal; Notable for the following components:   Color, Urine YELLOW (*)    APPearance HAZY (*)  Hgb urine dipstick MODERATE (*)    Ketones, ur 20 (*)    Bacteria, UA RARE (*)    All other components within normal limits  LIPASE, BLOOD  CBC  HCG, QUANTITATIVE, PREGNANCY  POC URINE PREG, ED     EKG  N/a   RADIOLOGY See ED course below    PROCEDURES:  Critical Care performed: No  Procedures   MEDICATIONS ORDERED IN ED: Medications  lidocaine  (LIDODERM ) 5 % 1 patch (1 patch Transdermal Patch Applied 08/28/23 1340)  acetaminophen  (TYLENOL ) tablet 1,000 mg (1,000 mg Oral Given 08/28/23 1339)     IMPRESSION / MDM / ASSESSMENT AND PLAN / ED COURSE  I reviewed the triage vital signs and the nursing notes.                              DDX/MDM/AP: Differential diagnosis includes, but is not limited to, nephro/urolithiasis, symptomatic cholelithiasis, cholecystitis, enteritis/colitis.  Do not suspect appendicitis, diverticulitis,  pancreatitis on my initial eval.  Consider muscular strain.  Do not suspect pulmonary pathology at this time.  Plan: - NPO - labs - Ultrasound right upper quadrant and renal - Tylenol , lidocaine  patch - Reassess  Patient's presentation is most consistent with acute presentation with potential threat to life or bodily function.    ED course below.  Overall reassuring.  Incidental colon polyp found, nonspecific, do not suspect etiology of pain, discussed findings with patient.  Unclear if pain may be muscular strain or possibly referred pain from her known endometriosis.  Pain adequately controlled in emergency department and does have follow-up with OB/GYN provider in 2 days.  Prescribed Lidoderm  Flexeril  and counseled to take Tylenol  and Motrin in addition.  ED return precautions in the place.  Patient agrees with plan.  Clinical Course as of 08/28/23 1635  Sun Aug 28, 2023  1325 Cbc wnl [MM]  1403 Cmp reviewed, overall unremarkable.  Hepatobiliary labs normal. [MM]  1458 Hcg neg [MM]  1512 Urinalysis reviewed, some ketonuria but otherwise unremarkable [MM]  1558 Renal ultrasound and abdominal ultrasound reviewed, unremarkable on my interpretation.  Radiology report notes incidental gallbladder polyp, discussed with patient. [MM]    Clinical Course User Index [MM] Collis Deaner, MD     FINAL CLINICAL IMPRESSION(S) / ED DIAGNOSES   Final diagnoses:  Right flank pain     Rx / DC Orders   ED Discharge Orders          Ordered    lidocaine  (LIDODERM ) 5 %  Every 24 hours        08/28/23 1633    cyclobenzaprine  (FLEXERIL ) 5 MG tablet  3 times daily PRN        08/28/23 1633             Note:  This document was prepared using Dragon voice recognition software and may include unintentional dictation errors.   Collis Deaner, MD 08/28/23 [phone]

## 2023-08-28 NOTE — ED Triage Notes (Addendum)
 Pt arrives via POV with c/o right sided flank pain that started this morning around 0500. Pt states that they are on the period and they have been experiencing abdominal pain with their periods since December but this is different from those pains. Pt denies any pain with urination. Pt normally experiences N/V/D which started on Tuesday so they are not sure if this is any different. Pt states that they are having a procedure on 5/8 to officially Dx endometriosis. Pt has a Hx of PCOS and is taking a semiglutide for weight control.

## 2023-08-28 NOTE — Discharge Instructions (Addendum)
 Your evaluation in the emergency department was reassuring.  I am unsure as to the exact cause of your pain, but I suspect you may have had a muscle strain. This pain may also be related to your endometriosis.  The evaluation of your gallbladder and kidneys was reassuring.  I recommend using Tylenol , Motrin, lidocaine  patches, and muscle relaxer I prescribed to help with the pain.  Please do follow-up with Dr. Alvia Awkward in 2 days as already planned, and return to the emergency department with any new or worsening symptoms.

## 2023-08-29 ENCOUNTER — Encounter: Payer: Self-pay | Admitting: Family Medicine

## 2023-09-02 NOTE — H&P (Signed)
 Christina Acevedo is a 33 y.o. female presenting with Pre Op Consulting (Sign consents)     History of Present Illness: Dysmenorrhea:  - mother hx of endometriosis - painful bowel movements - blood with urination during menses - periods were fine prior to coming off the OCPs (SE: weight gain), but doesn't want hormones if possible   Hormonal changes vast: fatigue, mood swings    Pertinent hx:   -Genital herpes, dx at urgent care 08/2019 after intense outbreak; video visit w/ me on coming to terms with dx, extensive counseling provided -> she is on valtrex daily for suppression    -Previously using extended cycle OCPs and using condoms              - she had weight gain with the pill and stopped taking    - Periods very painful 12/24 ddx endometriosis. Discussed all the natural methods of controlling.    -Recurrent BV for several years, improved with dilute H2O2 rinse and staying out of the pool, and stopped with a new partner  -Depression/anxiety -Hx of migraines  -Hemachromatosis; 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   Past Medical History:  has a past medical history of Anxiety, Endometriosis of uterus, Hemochromatosis, Herpes infection, Herpes simplex virus (HSV) infection, IBS (irritable bowel syndrome), and Migraine.  Past Surgical History:  has a past surgical history that includes Tonsillectomy; Colonoscopy; and egd. Family History: family history includes Breast cancer in her maternal grandmother; Colon cancer in her paternal grandfather; Diverticulitis in her father; Heart disease in her maternal grandfather; Hemochromatosis in her mother and sister; High blood pressure (Hypertension) in her mother; Lung cancer in her paternal aunt. Social History:  reports that she has been smoking cigarettes. She has quit using smokeless tobacco. She reports current alcohol use of about 3.0 standard drinks of alcohol per week. She reports that she does not use drugs. OB/GYN History:  OB History        Gravida 0   Para 0   Term 0   Preterm 0   AB 0   Living 0       SAB 0   IAB 0   Ectopic 0   Molar     Multiple 0   Live Births          Allergies: has No Known Allergies. Medications: Current Medications Current Outpatient Medications:    busPIRone  (BUSPAR ) 7.5 MG tablet, Take 1 tablet (7.5 mg total) by mouth 2 (two) times daily, Disp: 60 tablet, Rfl: 2   cholecalciferol 1000 unit tablet, Take by mouth, Disp: , Rfl:    cyclobenzaprine  (FLEXERIL ) 5 MG tablet, Take 5 mg by mouth 3 (three) times daily as needed, Disp: , Rfl:    eletriptan (RELPAX) 40 MG tablet, Take 1 tablet (40 mg total) by mouth as directed for Migraine May take a second dose after 2 hours if needed. Pharmacist: Give maximum amount of medicine allowed/month by patient's insurance., Disp: 18 tablet, Rfl: 6   lidocaine  (LIDODERM ) 5 % patch, Place 1 patch onto the skin, Disp: , Rfl:    magnesium glycinate 200 mg tablet, Take by mouth 2 (two) times daily at lunch and dinner, Disp: , Rfl:    omega 3-dha-epa-fish oil (FISH OIL) 1,000 mg (120 mg-180 mg) Cap, Take by mouth, Disp: , Rfl:    omeprazole magnesium (PRILOSEC ORAL), Take by mouth, Disp: , Rfl:    SEMAGLUTIDE SUBQ, Inject 50 Units subcutaneously Into the skin once a week, Disp: , Rfl:  valACYclovir (VALTREX) 1000 MG tablet, TAKE 1 TABLET (1,000 MG TOTAL) BY MOUTH ONCE DAILY, Disp: 90 tablet, Rfl: 1   albuterol 90 mcg/actuation inhaler, Inhale 2 inhalations into the lungs every 6 (six) hours as needed (Patient not taking: Reported on 04/25/2023), Disp: 18 g, Rfl: 0   busPIRone  (BUSPAR ) 5 MG tablet, Take 1 tablet (5 mg total) by mouth 2 (two) times daily for 90 days, Disp: 180 tablet, Rfl: 1   famotidine (PEPCID) 20 MG tablet, Take 20 mg by mouth 2 (two) times daily (Patient not taking: Reported on 08/30/2023), Disp: , Rfl:    medroxyPROGESTERone (PROVERA) 10 MG tablet, Take 1 tablet (10 mg total) by mouth once daily X 12 days (Patient not  taking: Reported on 04/25/2023), Disp: 12 tablet, Rfl: 3    Review of Systems: No SOB, no palpitations or chest pain, no new lower extremity edema, no nausea or vomiting or bowel or bladder complaints. See HPI for gyn specific ROS.    Exam:   BP 104/60   Ht 157.5 cm (5\' 2" )   Wt 68 kg (150 lb)   LMP 08/26/2023 (Approximate)   BMI 27.44 kg/m    General: Patient is well-groomed, well-nourished, appears stated age in no acute distress   HEENT: head is atraumatic and normocephalic, trachea is midline, neck is supple with no palpable nodules   CV: Regular rhythm and normal heart rate, no murmur   Pulm: Clear to auscultation throughout lung fields with no wheezing, crackles, or rhonchi. No increased work of breathing   Abdomen: soft , no mass, non-tender, no rebound tenderness, no hepatomegaly   Pelvic: deferred   Impression:   The primary encounter diagnosis was Primary dysmenorrhea. A diagnosis of PCOS (polycystic ovarian syndrome) was also pertinent to this visit.   Plan:   1.  -Patient returns for a preoperative discussion regarding her plans to proceed with surgical treatment of her endometriosis by dx laparoscopy with cysto with hydrodistention, possible excision of endo and lysis of adhesions. Will look at cervix and silver nitrate if friable at her request.     The patient and I discussed the technical aspects of the procedure including the potential for risks and complications.  These include but are not limited to the risk of infection requiring post-operative antibiotics or further procedures.  We talked about the risk of injury to adjacent organs including bladder, bowel, ureter, blood vessels or nerves.  We talked about the need to convert to an open incision.  We talked about the possible need for blood transfusion.  We talked about postop complications such as thromboembolic or cardiopulmonary complications.  All of her questions were answered.  Her preoperative exam was  completed and the appropriate consents were signed. She is scheduled to undergo this procedure in the near future.

## 2023-09-07 ENCOUNTER — Encounter
Admission: RE | Admit: 2023-09-07 | Discharge: 2023-09-07 | Disposition: A | Discharge status: Home / Self Care | Source: Ambulatory Visit | Attending: Obstetrics and Gynecology | Admitting: Obstetrics and Gynecology

## 2023-09-07 ENCOUNTER — Other Ambulatory Visit: Payer: Self-pay

## 2023-09-07 VITALS — Ht 62.0 in | Wt 141.0 lb

## 2023-09-07 DIAGNOSIS — Z01812 Encounter for preprocedural laboratory examination: Secondary | ICD-10-CM

## 2023-09-07 HISTORY — DX: Mixed hyperlipidemia: E78.2

## 2023-09-07 HISTORY — DX: Vitamin D deficiency, unspecified: E55.9

## 2023-09-07 NOTE — Patient Instructions (Addendum)
 Your procedure is scheduled on: Thursday, May 8 Report to the Registration Desk on the 1st floor of the [facility] To find out your arrival time, please call [phone] between 1PM - 3PM on: Wednesday, May 7 If your arrival time is 6:00 am, do not arrive before that time as the [facility] entrance doors do not open until 6:00 am.  REMEMBER: Instructions that are not followed completely may result in serious medical risk, up to and including death; or upon the discretion of your surgeon and anesthesiologist your surgery may need to be rescheduled.  Do not eat food after midnight the night before surgery.  No gum chewing or hard candies.  You may however, drink CLEAR liquids up to 2 hours before you are scheduled to arrive for your surgery. Do not drink anything within 2 hours of your scheduled arrival time.  Clear liquids include: - water  - apple juice without pulp - gatorade (not RED colors) - black coffee or tea (Do NOT add milk or creamers to the coffee or tea) Do NOT drink anything that is not on this list.  One week prior to surgery: Stop Anti-inflammatories (NSAIDS) such as Advil, Aleve, Ibuprofen, Motrin, Naproxen, Naprosyn and Aspirin based products such as Excedrin, Goody's Powder, BC Powder. Stop ANY OVER THE COUNTER supplements until after surgery. Stop vitamin C, vitamin B, magnesium, fish oil, multiple vitamins, probiotic, vitamin D /K, zinc.  You may however, continue to take Tylenol  if needed for pain up until the day of surgery.  Semaglutide - hold for 7 days before surgery. Do NOT take on Sunday, May 4. Resume AFTER surgery on your regular weekly day, Sunday, May 11.  Continue taking all of your other prescription medications up until the day of surgery.  ON THE DAY OF SURGERY ONLY TAKE THESE MEDICATIONS WITH SIPS OF WATER:  Buspirone  (Buspar ) Omeprazole (Prilosec)  No Alcohol for 24 hours before or after surgery.  No Smoking including e-cigarettes for 24  hours before surgery.  No chewable tobacco products for at least 6 hours before surgery.  No nicotine patches on the day of surgery.  Do not use any "recreational" drugs for at least a week (preferably 2 weeks) before your surgery.  Please be advised that the combination of cocaine and anesthesia may have negative outcomes, up to and including death. If you test positive for cocaine, your surgery will be cancelled.  On the morning of surgery brush your teeth with toothpaste and water, you may rinse your mouth with mouthwash if you wish. Do not swallow any toothpaste or mouthwash.  Use CHG Soap as directed on instruction sheet.  Do not wear jewelry, make-up, hairpins, clips or nail polish.  For welded (permanent) jewelry: bracelets, anklets, waist bands, etc.  Please have this removed prior to surgery.  If it is not removed, there is a chance that hospital personnel will need to cut it off on the day of surgery.  Do not wear lotions, powders, or perfumes.   Do not shave body hair from the neck down 48 hours before surgery.  Contact lenses, hearing aids and dentures may not be worn into surgery.  Do not bring valuables to the hospital. [facility] is not responsible for any missing/lost belongings or valuables.   Notify your doctor if there is any change in your medical condition (cold, fever, infection).  Wear comfortable clothing (specific to your surgery type) to the hospital.  After surgery, you can help prevent lung complications by doing  breathing exercises.  Take deep breaths and cough every 1-2 hours. Your doctor may order a device called an Incentive Spirometer to help you take deep breaths. When coughing or sneezing, hold a pillow firmly against your incision with both hands. This is called "splinting." Doing this helps protect your incision. It also decreases belly discomfort.  If you are being discharged the day of surgery, you will not be allowed to drive home. You will  need a responsible individual to drive you home and stay with you for 24 hours after surgery.   If you are taking public transportation, you will need to have a responsible individual with you.  Please call the Pre-admissions Testing Dept. at [phone] if you have any questions about these instructions.  Surgery Visitation Policy:  Patients having surgery or a procedure may have two visitors.  Children under the age of 66 must have an adult with them who is not the patient.      Preparing for Surgery with CHLORHEXIDINE GLUCONATE (CHG) Soap  Chlorhexidine Gluconate (CHG) Soap  o An antiseptic cleaner that kills germs and bonds with the skin to continue killing germs even after washing  o Used for showering the night before surgery and morning of surgery  Before surgery, you can play an important role by reducing the number of germs on your skin.  CHG (Chlorhexidine gluconate) soap is an antiseptic cleanser which kills germs and bonds with the skin to continue killing germs even after washing.  Please do not use if you have an allergy to CHG or antibacterial soaps. If your skin becomes reddened/irritated stop using the CHG.  1. Shower the NIGHT BEFORE SURGERY and the MORNING OF SURGERY with CHG soap.  2. If you choose to wash your hair, wash your hair first as usual with your normal shampoo.  3. After shampooing, rinse your hair and body thoroughly to remove the shampoo.  4. Use CHG as you would any other liquid soap. You can apply CHG directly to the skin and wash gently with a scrungie or a clean washcloth.  5. Apply the CHG soap to your body only from the neck down. Do not use on open wounds or open sores. Avoid contact with your eyes, ears, mouth, and genitals (private parts). Wash face and genitals (private parts) with your normal soap.  6. Wash thoroughly, paying special attention to the area where your surgery will be performed.  7. Thoroughly rinse your body with  warm water.  8. Do not shower/wash with your normal soap after using and rinsing off the CHG soap.  9. Pat yourself dry with a clean towel.  10. Wear clean pajamas to bed the night before surgery.  12. Place clean sheets on your bed the night of your first shower and do not sleep with pets.  13. Shower again with the CHG soap on the day of surgery prior to arriving at the hospital.  14. Do not apply any deodorants/lotions/powders.  15. Please wear clean clothes to the hospital.

## 2023-09-14 MED ORDER — POVIDONE-IODINE 10 % EX SWAB: 2.0000 | Freq: Once | CUTANEOUS | Status: DC

## 2023-09-14 MED ORDER — ORAL CARE MOUTH RINSE: 15.0000 mL | Freq: Once | OROMUCOSAL | Status: AC

## 2023-09-14 MED ORDER — LACTATED RINGERS IV SOLN: INTRAVENOUS | Status: DC

## 2023-09-14 MED ORDER — ACETAMINOPHEN 500 MG PO TABS
1000.0000 mg | ORAL_TABLET | ORAL | Status: AC
  Administered 2023-09-15: 1000 mg via ORAL

## 2023-09-14 MED ORDER — CELECOXIB 200 MG PO CAPS
400.0000 mg | ORAL_CAPSULE | ORAL | Status: AC
  Administered 2023-09-15: 400 mg via ORAL

## 2023-09-14 MED ORDER — CHLORHEXIDINE GLUCONATE 0.12 % MT SOLN
15.0000 mL | Freq: Once | OROMUCOSAL | Status: AC
  Administered 2023-09-15: 15 mL via OROMUCOSAL

## 2023-09-15 ENCOUNTER — Ambulatory Visit: Admitting: Anesthesiology

## 2023-09-15 ENCOUNTER — Other Ambulatory Visit: Payer: Self-pay

## 2023-09-15 ENCOUNTER — Encounter: Payer: Self-pay | Admitting: Obstetrics and Gynecology

## 2023-09-15 ENCOUNTER — Ambulatory Visit
Admission: RE | Admit: 2023-09-15 | Discharge: 2023-09-15 | Disposition: A | Discharge status: Home / Self Care | Attending: Obstetrics and Gynecology | Admitting: Obstetrics and Gynecology

## 2023-09-15 ENCOUNTER — Encounter
Admission: RE | Disposition: A | Discharge status: Home / Self Care | Payer: Self-pay | Source: Home / Self Care | Attending: Obstetrics and Gynecology

## 2023-09-15 DIAGNOSIS — R102 Pelvic and perineal pain: Secondary | ICD-10-CM | POA: Diagnosis present

## 2023-09-15 DIAGNOSIS — B0089 Other herpesviral infection: Secondary | ICD-10-CM | POA: Insufficient documentation

## 2023-09-15 DIAGNOSIS — G8929 Other chronic pain: Secondary | ICD-10-CM | POA: Diagnosis present

## 2023-09-15 DIAGNOSIS — N80329 Endometriosis of the posterior cul-de-sac, unspecified depth: Secondary | ICD-10-CM | POA: Diagnosis not present

## 2023-09-15 DIAGNOSIS — F17210 Nicotine dependence, cigarettes, uncomplicated: Secondary | ICD-10-CM | POA: Diagnosis not present

## 2023-09-15 DIAGNOSIS — Z01812 Encounter for preprocedural laboratory examination: Secondary | ICD-10-CM

## 2023-09-15 DIAGNOSIS — E282 Polycystic ovarian syndrome: Secondary | ICD-10-CM | POA: Insufficient documentation

## 2023-09-15 DIAGNOSIS — N803C2 Endometriosis of the left uterosacral ligament, unspecified depth: Secondary | ICD-10-CM | POA: Diagnosis not present

## 2023-09-15 HISTORY — PX: CHROMOPERTUBATION: SHX6288

## 2023-09-15 HISTORY — PX: LAPAROSCOPIC LYSIS OF ADHESIONS: SHX5905

## 2023-09-15 HISTORY — PX: CYSTOSCOPY: SHX5120

## 2023-09-15 HISTORY — PX: LAPAROSCOPY, DIAGNOSTIC, ROBOT-ASSISTED: SHX7606

## 2023-09-15 LAB — POCT PREGNANCY, URINE
Preg Test, Ur: NEGATIVE
Preg Test, Ur: NEGATIVE

## 2023-09-15 SURGERY — LAPAROSCOPY, DIAGNOSTIC, ROBOT-ASSISTED
Anesthesia: General | Site: Pelvis

## 2023-09-15 MED ORDER — GLYCOPYRROLATE 0.2 MG/ML IJ SOLN
INTRAMUSCULAR | Status: AC
  Filled 2023-09-15: qty 1

## 2023-09-15 MED ORDER — BUPIVACAINE HCL (PF) 0.5 % IJ SOLN
INTRAMUSCULAR | Status: AC
  Filled 2023-09-15: qty 30

## 2023-09-15 MED ORDER — ACETAMINOPHEN 500 MG PO TABS
ORAL_TABLET | ORAL | Status: AC
  Filled 2023-09-15: qty 2

## 2023-09-15 MED ORDER — FENTANYL CITRATE (PF) 100 MCG/2ML IJ SOLN
INTRAMUSCULAR | Status: AC
  Filled 2023-09-15: qty 2

## 2023-09-15 MED ORDER — OXYCODONE HCL 5 MG PO TABS: 5.0000 mg | ORAL_TABLET | ORAL | 0 refills | Status: AC | PRN

## 2023-09-15 MED ORDER — HYDROMORPHONE HCL 1 MG/ML IJ SOLN
INTRAMUSCULAR | Status: AC
  Filled 2023-09-15: qty 1

## 2023-09-15 MED ORDER — ONDANSETRON HCL 4 MG/2ML IJ SOLN
INTRAMUSCULAR | Status: DC | PRN
  Administered 2023-09-15 (×2): 4 mg via INTRAVENOUS

## 2023-09-15 MED ORDER — 0.9 % SODIUM CHLORIDE (POUR BTL) OPTIME
TOPICAL | Status: DC | PRN
  Administered 2023-09-15 (×2): 500 mL

## 2023-09-15 MED ORDER — SILVER NITRATE-POT NITRATE 75-25 % EX MISC
CUTANEOUS | Status: DC | PRN
  Administered 2023-09-15: 1

## 2023-09-15 MED ORDER — ROCURONIUM BROMIDE 10 MG/ML (PF) SYRINGE
PREFILLED_SYRINGE | INTRAVENOUS | Status: DC | PRN
Start: 2023-09-15 — End: 2023-09-15
  Administered 2023-09-15 (×2): 10 mg via INTRAVENOUS
  Administered 2023-09-15: 30 mg via INTRAVENOUS
  Administered 2023-09-15 (×4): 10 mg via INTRAVENOUS
  Administered 2023-09-15: 40 mg via INTRAVENOUS
  Administered 2023-09-15 (×2): 10 mg via INTRAVENOUS

## 2023-09-15 MED ORDER — FENTANYL CITRATE (PF) 100 MCG/2ML IJ SOLN
25.0000 ug | INTRAMUSCULAR | Status: DC | PRN
  Administered 2023-09-15: 50 ug via INTRAVENOUS

## 2023-09-15 MED ORDER — PHENYLEPHRINE 80 MCG/ML (10ML) SYRINGE FOR IV PUSH (FOR BLOOD PRESSURE SUPPORT)
PREFILLED_SYRINGE | INTRAVENOUS | Status: DC | PRN
Start: 2023-09-15 — End: 2023-09-15
  Administered 2023-09-15: 80 ug via INTRAVENOUS

## 2023-09-15 MED ORDER — DEXAMETHASONE SODIUM PHOSPHATE 10 MG/ML IJ SOLN
INTRAMUSCULAR | Status: AC
  Filled 2023-09-15: qty 1

## 2023-09-15 MED ORDER — MIDAZOLAM HCL 2 MG/2ML IJ SOLN
INTRAMUSCULAR | Status: AC
  Filled 2023-09-15: qty 2

## 2023-09-15 MED ORDER — GABAPENTIN 300 MG PO CAPS
300.0000 mg | ORAL_CAPSULE | Freq: Every day | ORAL | 0 refills | Status: AC
Start: 2023-09-15 — End: 2023-09-29

## 2023-09-15 MED ORDER — ONDANSETRON HCL 4 MG/2ML IJ SOLN
INTRAMUSCULAR | Status: AC
  Filled 2023-09-15: qty 4

## 2023-09-15 MED ORDER — HEMOSTATIC AGENTS (NO CHARGE) OPTIME
TOPICAL | Status: DC | PRN
  Administered 2023-09-15: 1

## 2023-09-15 MED ORDER — LACTATED RINGERS IV SOLN
INTRAVENOUS | Status: DC | PRN
Start: 2023-09-15 — End: 2023-09-15

## 2023-09-15 MED ORDER — OXYCODONE HCL 5 MG PO TABS
ORAL_TABLET | ORAL | Status: AC
  Filled 2023-09-15: qty 1

## 2023-09-15 MED ORDER — CEFAZOLIN SODIUM 1 G IJ SOLR
INTRAMUSCULAR | Status: AC
  Filled 2023-09-15: qty 20

## 2023-09-15 MED ORDER — PHENYLEPHRINE 80 MCG/ML (10ML) SYRINGE FOR IV PUSH (FOR BLOOD PRESSURE SUPPORT)
PREFILLED_SYRINGE | INTRAVENOUS | Status: AC
  Filled 2023-09-15: qty 10

## 2023-09-15 MED ORDER — ONDANSETRON HCL 4 MG/2ML IJ SOLN
INTRAMUSCULAR | Status: AC
  Filled 2023-09-15: qty 2

## 2023-09-15 MED ORDER — DROPERIDOL 2.5 MG/ML IJ SOLN: 0.6250 mg | Freq: Once | INTRAMUSCULAR | Status: DC | PRN

## 2023-09-15 MED ORDER — DEXMEDETOMIDINE HCL IN NACL 80 MCG/20ML IV SOLN
INTRAVENOUS | Status: AC
  Filled 2023-09-15: qty 20

## 2023-09-15 MED ORDER — DEXMEDETOMIDINE HCL IN NACL 80 MCG/20ML IV SOLN
INTRAVENOUS | Status: DC | PRN
  Administered 2023-09-15 (×5): 4 ug via INTRAVENOUS

## 2023-09-15 MED ORDER — PROPOFOL 1000 MG/100ML IV EMUL
INTRAVENOUS | Status: AC
  Filled 2023-09-15: qty 100

## 2023-09-15 MED ORDER — PROPOFOL 10 MG/ML IV BOLUS
INTRAVENOUS | Status: AC
  Filled 2023-09-15: qty 20

## 2023-09-15 MED ORDER — LIDOCAINE HCL (PF) 2 % IJ SOLN
INTRAMUSCULAR | Status: AC
  Filled 2023-09-15: qty 5

## 2023-09-15 MED ORDER — CHLORHEXIDINE GLUCONATE 0.12 % MT SOLN
OROMUCOSAL | Status: AC
Start: 2023-09-15 — End: ?
  Filled 2023-09-15: qty 15

## 2023-09-15 MED ORDER — PROPOFOL 10 MG/ML IV BOLUS
INTRAVENOUS | Status: DC | PRN
Start: 2023-09-15 — End: 2023-09-15
  Administered 2023-09-15: 120 mg via INTRAVENOUS
  Administered 2023-09-15: 150 ug/kg/min via INTRAVENOUS

## 2023-09-15 MED ORDER — LIDOCAINE HCL (CARDIAC) PF 100 MG/5ML IV SOSY
PREFILLED_SYRINGE | INTRAVENOUS | Status: DC | PRN
  Administered 2023-09-15: 60 mg via INTRATRACHEAL

## 2023-09-15 MED ORDER — SUGAMMADEX SODIUM 200 MG/2ML IV SOLN
INTRAVENOUS | Status: DC | PRN
  Administered 2023-09-15 (×3): 50 mg via INTRAVENOUS

## 2023-09-15 MED ORDER — CELECOXIB 200 MG PO CAPS
ORAL_CAPSULE | ORAL | Status: AC
  Filled 2023-09-15: qty 2

## 2023-09-15 MED ORDER — CEFAZOLIN SODIUM-DEXTROSE 1-4 GM/50ML-% IV SOLN
INTRAVENOUS | Status: DC | PRN
  Administered 2023-09-15: 2 g via INTRAVENOUS

## 2023-09-15 MED ORDER — SENNOSIDES-DOCUSATE SODIUM 8.6-50 MG PO TABS: 1.0000 | ORAL_TABLET | Freq: Every day | ORAL | 1 refills | Status: AC

## 2023-09-15 MED ORDER — GLYCOPYRROLATE 0.2 MG/ML IJ SOLN
INTRAMUSCULAR | Status: DC | PRN
  Administered 2023-09-15: .2 mg via INTRAVENOUS

## 2023-09-15 MED ORDER — METHYLENE BLUE (ANTIDOTE) 1 % IV SOLN
INTRAVENOUS | Status: DC | PRN
  Administered 2023-09-15: 1 mL

## 2023-09-15 MED ORDER — ONDANSETRON 4 MG PO TBDP
4.0000 mg | ORAL_TABLET | Freq: Three times a day (TID) | ORAL | 0 refills | Status: AC | PRN
Start: 2023-09-15 — End: 2023-09-22

## 2023-09-15 MED ORDER — MIDAZOLAM HCL 2 MG/2ML IJ SOLN
INTRAMUSCULAR | Status: DC | PRN
Start: 2023-09-15 — End: 2023-09-15
  Administered 2023-09-15: 2 mg via INTRAVENOUS

## 2023-09-15 MED ORDER — ACETAMINOPHEN EXTRA STRENGTH 500 MG PO TABS
1000.0000 mg | ORAL_TABLET | Freq: Four times a day (QID) | ORAL | 0 refills | Status: AC
Start: 2023-09-15 — End: 2023-09-18

## 2023-09-15 MED ORDER — CELECOXIB 200 MG PO CAPS: 200.0000 mg | ORAL_CAPSULE | Freq: Two times a day (BID) | ORAL | 0 refills | Status: AC

## 2023-09-15 MED ORDER — HYDROMORPHONE HCL 1 MG/ML IJ SOLN
INTRAMUSCULAR | Status: DC | PRN
  Administered 2023-09-15 (×2): .5 mg via INTRAVENOUS

## 2023-09-15 MED ORDER — OXYCODONE HCL 5 MG/5ML PO SOLN: 5.0000 mg | Freq: Once | ORAL | Status: AC | PRN

## 2023-09-15 MED ORDER — FENTANYL CITRATE (PF) 100 MCG/2ML IJ SOLN
INTRAMUSCULAR | Status: DC | PRN
  Administered 2023-09-15 (×2): 50 ug via INTRAVENOUS

## 2023-09-15 MED ORDER — BUPIVACAINE HCL 0.5 % IJ SOLN
INTRAMUSCULAR | Status: DC | PRN
  Administered 2023-09-15: 14 mL

## 2023-09-15 MED ORDER — SILVER NITRATE-POT NITRATE 75-25 % EX MISC
CUTANEOUS | Status: AC
  Filled 2023-09-15: qty 10

## 2023-09-15 MED ORDER — METHYLENE BLUE (ANTIDOTE) 1 % IV SOLN
INTRAVENOUS | Status: AC
  Filled 2023-09-15: qty 10

## 2023-09-15 MED ORDER — OXYCODONE HCL 5 MG PO TABS
5.0000 mg | ORAL_TABLET | Freq: Once | ORAL | Status: AC | PRN
  Administered 2023-09-15: 5 mg via ORAL

## 2023-09-15 MED ORDER — DEXAMETHASONE SODIUM PHOSPHATE 10 MG/ML IJ SOLN
INTRAMUSCULAR | Status: DC | PRN
Start: 2023-09-15 — End: 2023-09-15
  Administered 2023-09-15: 10 mg via INTRAVENOUS

## 2023-09-15 SURGICAL SUPPLY — 84 items
APPLICATOR ARISTA FLEXITIP XL (MISCELLANEOUS) IMPLANT
BAG URINE DRAIN 2000ML AR STRL (UROLOGICAL SUPPLIES) ×4 IMPLANT
BARRIER ADHS 3X4 INTERCEED (GAUZE/BANDAGES/DRESSINGS) IMPLANT
BLADE SURG SZ11 CARB STEEL (BLADE) ×4 IMPLANT
CATH ROBINSON RED A/P 16FR (CATHETERS) ×4 IMPLANT
CATH URTH 16FR FL 2W BLN LF (CATHETERS) ×4 IMPLANT
CORD MONOPOLAR M/FML 12FT (MISCELLANEOUS) ×4 IMPLANT
COUNTER NDL MAGNETIC 40 RED (SET/KITS/TRAYS/PACK) ×4 IMPLANT
COUNTER NEEDLE MAGNETIC 40 RED (SET/KITS/TRAYS/PACK) IMPLANT
COVER MAYO STAND STRL (DRAPES) IMPLANT
COVER TIP SHEARS 8 DVNC (MISCELLANEOUS) ×4 IMPLANT
COVER WAND RF STERILE (DRAPES) ×4 IMPLANT
DERMABOND ADVANCED .7 DNX12 (GAUZE/BANDAGES/DRESSINGS) ×4 IMPLANT
DRAPE ARM DVNC X/XI (DISPOSABLE) ×12 IMPLANT
DRAPE COLUMN DVNC XI (DISPOSABLE) ×4 IMPLANT
DRAPE SHEET LG 3/4 BI-LAMINATE (DRAPES) ×4 IMPLANT
DRAPE STERI POUCH LG 24X46 STR (DRAPES) IMPLANT
DRSG TEGADERM 2-3/8X2-3/4 SM (GAUZE/BANDAGES/DRESSINGS) IMPLANT
DRSG TEGADERM 4X4.75 (GAUZE/BANDAGES/DRESSINGS) IMPLANT
DRSG TELFA 3X8 NADH STRL (GAUZE/BANDAGES/DRESSINGS) IMPLANT
ELECTRODE REM PT RTRN 9FT ADLT (ELECTROSURGICAL) ×4 IMPLANT
FORCEPS BPLR FENES DVNC XI (FORCEP) ×4 IMPLANT
GAUZE 4X4 16PLY ~~LOC~~+RFID DBL (SPONGE) ×4 IMPLANT
GAUZE SPONGE 2X2 8PLY STRL LF (GAUZE/BANDAGES/DRESSINGS) IMPLANT
GLOVE BIO SURGEON STRL SZ7 (GLOVE) ×16 IMPLANT
GLOVE INDICATOR 7.5 STRL GRN (GLOVE) ×16 IMPLANT
GLOVE SURG SYN 8.0 (GLOVE) ×4 IMPLANT
GLOVE SURG SYN 8.0 PF PI (GLOVE) ×4 IMPLANT
GOWN STRL REUS W/ TWL LRG LVL3 (GOWN DISPOSABLE) ×16 IMPLANT
GOWN STRL REUS W/ TWL XL LVL3 (GOWN DISPOSABLE) ×4 IMPLANT
GRASPER SUT TROCAR 14GX15 (MISCELLANEOUS) ×4 IMPLANT
HEMOSTAT ARISTA ABSORB 1G (HEMOSTASIS) IMPLANT
IRRIGATION STRYKERFLOW (MISCELLANEOUS) IMPLANT
IRRIGATOR SUCT 8 DISP DVNC XI (IRRIGATION / IRRIGATOR) IMPLANT
IV LACTATED RINGERS 1000ML (IV SOLUTION) ×4 IMPLANT
IV NS 1000ML BAXH (IV SOLUTION) ×4 IMPLANT
KIT PINK PAD W/HEAD ARE REST (MISCELLANEOUS) ×4 IMPLANT
KIT PINK PAD W/HEAD ARM REST (MISCELLANEOUS) ×4 IMPLANT
KIT PROCEDURE FLUENT (KITS) IMPLANT
KIT TURNOVER CYSTO (KITS) ×4 IMPLANT
LABEL OR SOLS (LABEL) ×4 IMPLANT
LHOOK LAP DISP 36CM (ELECTROSURGICAL) IMPLANT
LIGASURE VESSEL 5MM BLUNT TIP (ELECTROSURGICAL) IMPLANT
MANIFOLD NEPTUNE II (INSTRUMENTS) ×4 IMPLANT
MANIPULATOR UTERINE 4.5 ZUMI (MISCELLANEOUS) ×4 IMPLANT
NDL DRIVE SUT CUT DVNC (INSTRUMENTS) ×4 IMPLANT
NDL SAFETY ECLIPSE 18X1.5 (NEEDLE) IMPLANT
NEEDLE DRIVE SUT CUT DVNC (INSTRUMENTS) ×4 IMPLANT
NS IRRIG 1000ML POUR BTL (IV SOLUTION) ×4 IMPLANT
NS IRRIG 500ML POUR BTL (IV SOLUTION) ×4 IMPLANT
OBTURATOR OPTICALSTD 8 DVNC (TROCAR) ×4 IMPLANT
PACK GYN LAPAROSCOPIC (MISCELLANEOUS) ×4 IMPLANT
PAD OB MATERNITY 11 LF (PERSONAL CARE ITEMS) ×4 IMPLANT
PAD PREP OB/GYN DISP 24X41 (PERSONAL CARE ITEMS) ×4 IMPLANT
POWDER SURGICEL 3.0 GRAM (HEMOSTASIS) IMPLANT
SCISSORS METZENBAUM CVD 33 (INSTRUMENTS) IMPLANT
SCISSORS MNPLR CVD DVNC XI (INSTRUMENTS) ×4 IMPLANT
SCRUB CHG 4% DYNA-HEX 4OZ (MISCELLANEOUS) ×4 IMPLANT
SEAL ROD LENS SCOPE MYOSURE (ABLATOR) IMPLANT
SEAL UNIV 5-12 XI (MISCELLANEOUS) ×12 IMPLANT
SEALER VESSEL EXT DVNC XI (MISCELLANEOUS) IMPLANT
SET CYSTO W/LG BORE CLAMP LF (SET/KITS/TRAYS/PACK) ×4 IMPLANT
SET TUBE SMOKE EVAC HIGH FLOW (TUBING) ×4 IMPLANT
SLEEVE Z-THREAD 5X100MM (TROCAR) ×4 IMPLANT
SOL .9 NS 3000ML IRR UROMATIC (IV SOLUTION) IMPLANT
SOLUTION ELECTROSURG ANTI STCK (MISCELLANEOUS) ×4 IMPLANT
SOLUTION PREP PVP 2OZ (MISCELLANEOUS) ×4 IMPLANT
STRIP CLOSURE SKIN 1/4X4 (GAUZE/BANDAGES/DRESSINGS) IMPLANT
SURGILUBE 2OZ TUBE FLIPTOP (MISCELLANEOUS) ×4 IMPLANT
SUT MNCRL AB 4-0 PS2 18 (SUTURE) ×4 IMPLANT
SUT STRATA 2-0 30 CT-2 (SUTURE) IMPLANT
SUT VIC AB 0 CT2 27 (SUTURE) ×8 IMPLANT
SUT VIC AB 2-0 CT2 27 (SUTURE) IMPLANT
SUTURE MNCRL 4-0 27XMF (SUTURE) ×4 IMPLANT
SYR 10ML LL (SYRINGE) IMPLANT
SYR 50ML LL SCALE MARK (SYRINGE) ×4 IMPLANT
SYR 5ML LL (SYRINGE) IMPLANT
SYSTEM BAG RETRIEVAL 10MM (BASKET) IMPLANT
TIP ENDOSCOPIC SURGICEL (TIP) ×4 IMPLANT
TRAP FLUID SMOKE EVACUATOR (MISCELLANEOUS) ×4 IMPLANT
TROCAR Z-THREAD FIOS 5X100MM (TROCAR) ×4 IMPLANT
TUBING ART PRESS 48 MALE/FEM (TUBING) IMPLANT
WATER STERILE IRR 3000ML UROMA (IV SOLUTION) IMPLANT
WATER STERILE IRR 500ML POUR (IV SOLUTION) ×4 IMPLANT

## 2023-09-15 NOTE — Transfer of Care (Signed)
 Immediate Anesthesia Transfer of Care Note  Patient: Christina Acevedo  Procedure(s) Performed: LAPAROSCOPY, DIAGNOSTIC, ROBOT-ASSISTED; DIAGNOSTIC HYSTEROSCOPY (Pelvis) EXCISION, ENDOMETRIOSIS, LAPAROSCOPIC (Pelvis) CYSTOSCOPY WITH HYDRODISTENTION (Bladder) URETERA LYSIS, ADHESIONS, LAPAROSCOPIC (Bilateral) CHROMOPERTUBATION, FALLOPIAN TUBE (Pelvis)  Patient Location: PACU  Anesthesia Type:General  Level of Consciousness: awake, alert , oriented, and patient cooperative  Airway & Oxygen Therapy: Patient Spontanous Breathing and Patient connected to nasal cannula oxygen  Post-op Assessment: Report given to RN and Post -op Vital signs reviewed and stable  Post vital signs: Reviewed and stable  Last Vitals:  Vitals Value Taken Time  BP 87/68 09/15/23 1646  Temp    Pulse 89 09/15/23 1651  Resp 19 09/15/23 1651  SpO2 100 % 09/15/23 1651  Vitals shown include unfiled device data.  Last Pain:  Vitals:   09/15/23 0858  TempSrc: Temporal  PainSc: 0-No pain         Complications: No notable events documented.

## 2023-09-15 NOTE — Discharge Instructions (Signed)
 For the next three days, take ibuprofen and acetaminophen  on a schedule, every 8 hours. You can take them together or you can intersperse them, and take one every four hours. I also gave you gabapentin for nighttime, to help you sleep and also to control pain. Take gabapentin medicines at night for at least the next 3 nights. You also have a narcotic, oxycodone, to take as needed if the above medicines don't help.  Postop constipation is a major cause of pain. Stay well hydrated, walk as you tolerate, and I recommend keeping your stools loose with daily MiraLAX, as well as senna and docusate.   Signs and Symptoms to Report Call our office at [phone] if you have any of the following.   Fever over 100.4 degrees or higher  Severe stomach pain not relieved with pain medications  Bright red bleeding that's heavier than a period that does not slow with rest  To go the bathroom a lot (frequency), you can't hold your urine (urgency), or it hurts when you empty your bladder (urinate)  Chest pain  Shortness of breath  Pain in the calves of your legs  Severe nausea and vomiting not relieved with anti-nausea medications  Signs of infection around your wounds, such as redness, hot to touch, swelling, green/yellow drainage (like pus), bad smelling discharge  Any concerns  What You Can Expect after Surgery  You may see some pink tinged, bloody fluid and bruising around the wound. This is normal.  You may notice shoulder and neck pain. This is caused by the gas used during surgery to expand your abdomen so your surgeon could get to the uterus easier.  You may have a sore throat because of the tube in your mouth during general anesthesia. This will go away in 2 to 3 days.  You may have some stomach cramps.  You may notice spotting on your panties.  You may have pain around the incision sites.   Activities after Your Discharge Follow these guidelines to help speed your recovery at home:  Do the  coughing and deep breathing as you did in the hospital for 2 weeks. Use the small blue breathing device, called the incentive spirometer for 2 weeks.  Don't drive if you are in pain or taking narcotic pain medicine. You may drive when you can safely slam on the brakes, turn the wheel forcefully, and rotate your torso comfortably. This is typically 1-2 weeks. Practice in a parking lot or side street prior to attempting to drive regularly.   Ask others to help with household chores for 4 weeks.  Do not lift anything heavier that 10 pounds for 4-6 weeks. This includes pets, children, and groceries.  Don't do strenuous activities, exercises, or sports like vacuuming, tennis, squash, etc. until your doctor says it is safe to do so. ---If you had a hysterectomy (abdominal, laparoscopic, or vaginal) do not have intercourse for 8-10 weeks.   Walk as you feel able. Rest often since it may take two or three weeks for your energy level to return to normal.   You may climb stairs  Avoid constipation:   -Eat fruits, vegetables, and whole grains. Eat small meals as your appetite will take time to return to normal.   -Drink 6 to 8 glasses of water each day unless your doctor has told you to limit your fluids.   -Use a laxative or stool softener as needed if constipation becomes a problem. You may take Miralax, metamucil, Citrucil, Colace,  Senekot, FiberCon, etc. If this does not relieve the constipation, try two tablespoons of Milk Of Magnesia every 8 hours until your bowels move.   You may shower. Gently wash the wounds with a mild soap and water. Pat dry.  Do not get in a hot tub, swimming pool, etc. until your doctor agrees.  Do not use lotions, oils, powders on the wounds.  Do not douche, use tampons, or have sex until your doctor says it is okay.  Take your pain medicine when you need it. The medicine may not work as well if the pain is bad.  Take the medicines you were taking before surgery. Other  medications you will need are pain medications (Norco or Percocet) and nausea medications (Zofran).

## 2023-09-15 NOTE — Op Note (Signed)
 Christina Acevedo PROCEDURE DATE: 09/15/2023  PREOPERATIVE DIAGNOSIS: Chronic pelvic pain POSTOPERATIVE DIAGNOSIS: Stage 4 endometriosis  PROCEDURE:  LAPAROSCOPY, DIAGNOSTIC, ROBOT-ASSISTED; DIAGNOSTIC HYSTEROSCOPY: 49320 (CPT) EXCISION, ENDOMETRIOSIS, LAPAROSCOPIC: 16109 (CPT) CYSTOSCOPY WITH HYDRODISTENTION: 52260 (CPT) URETERA LYSIS, ADHESIONS, LAPAROSCOPIC: 58660 (CPT) CHROMOPERTUBATION, FALLOPIAN TUBE: 58350 (CPT) Left ovarian cystectomy Lysis of adhesions  SURGEON:  Dr. Prescilla Brod ASSISTANT: CST ANESTHESIOLOGIST: No responsible provider has been recorded for the case. Anesthesiologist: Vanice Genre, MD; Nancey Awkward, MD CRNA: Bobette Burrs, CRNA; Sloan Duncans, CRNA  INDICATIONS: 32 y.o. No obstetric history on file. with history of chronic pelvic pain desiring surgical evaluation.   Please see preoperative notes for further details.  Risks of surgery were discussed with the patient including but not limited to: bleeding which may require transfusion or reoperation; infection which may require antibiotics; injury to bowel, bladder, ureters or other surrounding organs; need for additional procedures including laparotomy; thromboembolic phenomenon, incisional problems and other postoperative/anesthesia complications. Written informed consent was obtained.    FINDINGS:  Significant endometriosis noted throughout the pelvis.  Please see operative pictures for full review.  In short, she had dark lesions on her diaphragm on the right, right sided abdominal sidewall peritoneum, left lower quadrant that were adherent to the bowel above the pelvic brim.  There was deep infiltrating endometriosis in her left ovarian fossa, through her left uterosacral ligament, and into the posterior cul-de-sac.  The rectovaginal septum did have deep infiltrating endometriosis on it.  The right ovarian fossa was also significantly scarred from endometriosis.  Both ureters required retroperitoneal  dissection to excise the endometriosis causing retroperitoneal fibrosis, left greater than right.  She had endometriosis on her bladder bilaterally in the anterior pelvis, on her abdominal peritoneum above the pelvis at the level of her obliterated umbilical arteries, and throughout the right broad ligament.  Both ovaries did have superficial endometriosis, and the right ovary was adherent to the pelvic sidewall with dark pockets of endometriosis holding it to the peritoneum.  The left fallopian tube was splayed large round of paratubal cyst and adherent to it across the base of it.  There was a large left simple ovarian cyst which was excised, and did not appear to be an endometrioma.  All areas of endometriosis were excised, including the deep infiltrating retroperitoneal areas.  The only endometriosis left visible were the spots on the lower sigmoid colon at the most distal end of the rectum beneath the rectovaginal septum.  Otherwise, all of the peritoneum in the bilateral ovarian fossa, as well as the infiltrating endometriosis near and behind the ureters, endometriosis in the abdomen and bilateral lower quadrants were all excised.  The diaphragm and anterior abdominal wall above the liver were left intact.  We did perform chromotubation, and there was no spill from either tube.  However, both tubes did appear to be normally shaped and sized, with no evidence of hydrosalpinx or endometriosis scarring across the external surface of the tube.  The appendix appeared normal.  All surfaces were very friable throughout the case, but blood loss was insignificant.  ANESTHESIA:    General INTRAVENOUS FLUIDS: 1300 ml ESTIMATED BLOOD LOSS: 5 ml URINE OUTPUT: 200 ml SPECIMENS: Peritoneal excision throughout. Left ovarian cyst - 14 total specimens COMPLICATIONS: None immediate  PROCEDURE IN DETAIL:  The patient had sequential compression devices applied to her lower extremities while in the preoperative  area.  She was then taken to the operating room where general anesthesia was administered and was found to be  adequate.  She was placed in the dorsal lithotomy position, and was prepped and draped in a sterile manner.  A Foley catheter was inserted into her bladder and attached to constant drainage and a uterine manipulator was then advanced into the uterus.  After an adequate timeout was performed, attention was turned to the abdomen where an umbilical incision was made with the scalpel.  The 8-mm robotic trocar and sleeve were then advanced without difficulty with the laparoscope under direct visualization into the abdomen.  The abdomen was then insufflated with carbon dioxide gas and adequate pneumoperitoneum was obtained.   A detailed survey of the patient's pelvis and abdomen revealed the findings as mentioned above.  3 more robotic ports were placed laterally under direct visualization.  A full survey of a of the pelvis, lower and upper abdomen was undertaken.  Endometriosis that was along the abdominal pelvic sidewall and adherent to the bowel were removed and excised in full.  Lysis of adhesions was undertaken to restore normal bowel anatomy.  Working from superior to inferior, the deep endometriosis along the anterior pelvic wall was excised, being careful to avoid the bladder.  We did use bladder backfilling to delineate these areas.  1 area of the bladder was oversewn on the right side, but no bladder injury happened.  The detrusor muscles fibers were noted just below the peritoneum here, and that area was reinforced with 2-0 Vicryl.  Using careful blunt and sharp dissection with cautery to maintain hemostasis, the left ovarian fossa peritoneum was carefully dissected out.  The left ureter was carefully dissected out, as endometriosis had scarred deeply into the retroperitoneal space in this area.  The left ureteral sacral ligament was also dissected out, and the peritoneum removed.  There was  deep infiltrating endometriosis along the left cervix and your uterosacral ligament, leading into the partially obliterated posterior cul-de-sac.  Great care was taken around the rectum, which was left on molested.  The peritoneum was peeled off of the rectum vaginal septum and most of the endometriosis in this area was able to be safely removed.  The same was performed on the right side, and along the right ovary, which had endometriosis scarring to the pelvic sidewall.  The operative site was surveyed, and it was found to be hemostatic.  No intraoperative injury to surrounding organs was noted.   Chromotubation was performed, and a total of 100 mL of blue dyed fluid was instilled into the uterus.  There was no spillage from either fallopian tube.  3 g of Arista was used to coat all of the friable areas, and 2 full sheets of Interceed were used in the ovarian fossa and wrapped around the ovaries to prevent retroperitoneal scarring of the  ovaries.  Pictures were taken of the quadrants and pelvis. The abdomen was desufflated and all instruments were then removed from the patient's abdomen. The uterine manipulator was removed without complications.  All incisions were closed with 4-0 Monocryl and Dermabond.   CYSTOCOPY PARAGRAPH The Foley catheter was removed and an uncomplicated cystoscopy was performed. 800 mL of fluid was instilled for hydro-distention, drained, and the bladder mucosa was visualized again with the above findings. Excellent efflux was noted from both ureteral orifices.  On the right side, there was bilateral E flux from 2 ureter openings.  Attention was turned to the uterus, as the chromotubation fluid was not noted to leave either the fallopian tubes or from the cervical os.  This area was noted to be  intact.  The uterus was intact without perforation.  The surfaces of her tissues were very friable throughout.  The patient tolerated the procedures well.  All instruments, needles,  and sponge counts were correct x 2. The patient was taken to the recovery room in stable condition.   Plan for same-day discharge with the rest protocol.  Will plan for medical suppression of endometriosis in the postop period.  I recommend an HSG when she has healed if she is experiencing infertility.

## 2023-09-15 NOTE — Interval H&P Note (Signed)
 History and Physical Interval Note:  09/15/2023 11:59 AM  Christina Acevedo  has presented today for surgery, with the diagnosis of chronic pelvic pain.  The various methods of treatment have been discussed with the patient and family. After consideration of risks, benefits and other options for treatment, the patient has consented to  Procedure(s): LAPAROSCOPY, DIAGNOSTIC, ROBOT-ASSISTED (N/A) POSSIBLE EXCISION, ENDOMETRIOSIS, LAPAROSCOPIC (N/A) CYSTOSCOPY WITH HYDRODISTENTION (N/A) POSSIBLE LYSIS, ADHESIONS, LAPAROSCOPIC (N/A) as a surgical intervention.  The patient's history has been reviewed, patient examined, no change in status, stable for surgery.  I have reviewed the patient's chart and labs.  Questions were answered to the patient's satisfaction.     Prescilla Brod

## 2023-09-15 NOTE — Anesthesia Procedure Notes (Signed)
 Procedure Name: Intubation Date/Time: 09/15/2023 12:14 PM  Performed by: Sloan Duncans, CRNAPre-anesthesia Checklist: Patient identified, Emergency Drugs available, Suction available and Patient being monitored Patient Re-evaluated:Patient Re-evaluated prior to induction Oxygen Delivery Method: Circle system utilized Preoxygenation: Pre-oxygenation with 100% oxygen Induction Type: IV induction Ventilation: Mask ventilation without difficulty Laryngoscope Size: Mac and 3 Grade View: Grade I Tube type: Oral Tube size: 7.0 mm Number of attempts: 1 Placement Confirmation: ETT inserted through vocal cords under direct vision, positive ETCO2 and breath sounds checked- equal and bilateral Secured at: 21 cm Tube secured with: Tape Dental Injury: Teeth and Oropharynx as per pre-operative assessment

## 2023-09-15 NOTE — Anesthesia Preprocedure Evaluation (Signed)
 Anesthesia Evaluation  Patient identified by MRN, date of birth, ID band Patient awake    Reviewed: Allergy & Precautions, NPO status , Patient's Chart, lab work & pertinent test results  History of Anesthesia Complications Negative for: history of anesthetic complications  Airway Mallampati: I  TM Distance: >3 FB Neck ROM: full    Dental no notable dental hx.    Pulmonary neg pulmonary ROS, Current Smoker and Patient abstained from smoking.   Pulmonary exam normal        Cardiovascular negative cardio ROS Normal cardiovascular exam     Neuro/Psych  Headaches PSYCHIATRIC DISORDERS Anxiety        GI/Hepatic negative GI ROS, Neg liver ROS,,,  Endo/Other  negative endocrine ROS    Renal/GU negative Renal ROS  negative genitourinary   Musculoskeletal   Abdominal   Peds  Hematology negative hematology ROS (+)   Anesthesia Other Findings Past Medical History: No date: Anxiety No date: Chicken pox No date: Chronic headache     Comment:  migraines  No date: COVID-19     Comment:  end 05/2020 early 06/2020 No date: Hemochromatosis     Comment:  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  No date: High cholesterol No date: HSV (herpes simplex virus) infection     Comment:  1/2 as of 02/2020 No date: IBS (irritable bowel syndrome)     Comment:  mixed No date: Mixed hyperlipidemia No date: PCOS (polycystic ovarian syndrome) No date: UTI (urinary tract infection)     Comment:  E coli  No date: Vitamin D  deficiency  Past Surgical History: 07/04/2017: COLONOSCOPY WITH PROPOFOL ; N/A     Comment:  Procedure: COLONOSCOPY WITH PROPOFOL ;  Surgeon: Selena Daily, MD;  Location: [facility] END[facility]COPY;  Service:               Gastroenterology;  Laterality: N/A; 07/04/2017: ESOPHAGOGASTRODUODENOSCOPY (EGD) WITH PROPOFOL ; N/A     Comment:  Procedure: ESOPHAGOGASTRODUODENOSCOPY (EGD) WITH               PROPOFOL ;  Surgeon:  Selena Daily, MD;  Location:               [facility] ENDOSCOPY;  Service: Gastroenterology;  Laterality:               N/A; 2009: TONSILLECTOMY No date: WISDOM TOOTH EXTRACTION; Bilateral     Reproductive/Obstetrics negative OB ROS                             Anesthesia Physical Anesthesia Plan  ASA: 2  Anesthesia Plan: General ETT   Post-op Pain Management: Tylenol  PO (pre-op)*, Celebrex PO (pre-op)*, Dilaudid IV and Ketamine IV*   Induction: Intravenous  PONV Risk Score and Plan: Ondansetron, Dexamethasone , Midazolam and Treatment may vary due to age or medical condition  Airway Management Planned: Oral ETT  Additional Equipment:   Intra-op Plan:   Post-operative Plan: Extubation in OR  Informed Consent: I have reviewed the patients History and Physical, chart, labs and discussed the procedure including the risks, benefits and alternatives for the proposed anesthesia with the patient or authorized representative who has indicated his/her understanding and acceptance.     Dental Advisory Given  Plan Discussed with: Anesthesiologist, CRNA and Surgeon  Anesthesia Plan Comments: (Patient consented for risks of anesthesia including but not limited to:  - adverse reactions to medications -  damage to eyes, teeth, lips or other oral mucosa - nerve damage due to positioning  - sore throat or hoarseness - Damage to heart, brain, nerves, lungs, other parts of body or loss of life  Patient voiced understanding and assent.)       Anesthesia Quick Evaluation

## 2023-09-16 ENCOUNTER — Encounter: Payer: Self-pay | Admitting: Obstetrics and Gynecology

## 2023-09-17 NOTE — Anesthesia Postprocedure Evaluation (Signed)
 Anesthesia Post Note  Patient: Christina Acevedo  Procedure(s) Performed: LAPAROSCOPY, DIAGNOSTIC, ROBOT-ASSISTED; DIAGNOSTIC HYSTEROSCOPY (Pelvis) EXCISION, ENDOMETRIOSIS, LAPAROSCOPIC (Pelvis) CYSTOSCOPY WITH HYDRODISTENTION (Bladder) URETERA LYSIS, ADHESIONS, LAPAROSCOPIC (Bilateral) CHROMOPERTUBATION, FALLOPIAN TUBE (Pelvis)  Patient location during evaluation: PACU Anesthesia Type: General Level of consciousness: awake and alert Pain management: pain level controlled Vital Signs Assessment: post-procedure vital signs reviewed and stable Respiratory status: spontaneous breathing, nonlabored ventilation, respiratory function stable and patient connected to nasal cannula oxygen Cardiovascular status: blood pressure returned to baseline and stable Postop Assessment: no apparent nausea or vomiting Anesthetic complications: no   No notable events documented.   Last Vitals:  Vitals:   09/15/23 1730 09/15/23 1743  BP: 102/77 112/87  Pulse: 62 64  Resp: 13 14  Temp:  36.8 C  SpO2: 96% 98%    Last Pain:  Vitals:   09/15/23 1743  TempSrc: Temporal  PainSc:                  Vanice Genre

## 2023-09-19 LAB — SURGICAL PATHOLOGY

## 2024-02-13 ENCOUNTER — Encounter: Attending: Psychology | Admitting: Psychology

## 2024-02-13 DIAGNOSIS — F419 Anxiety disorder, unspecified: Secondary | ICD-10-CM

## 2024-02-13 DIAGNOSIS — R41840 Attention and concentration deficit: Secondary | ICD-10-CM | POA: Diagnosis not present

## 2024-02-13 DIAGNOSIS — F32A Depression, unspecified: Secondary | ICD-10-CM | POA: Diagnosis not present

## 2024-02-24 ENCOUNTER — Encounter: Admitting: Psychology

## 2024-02-24 DIAGNOSIS — F902 Attention-deficit hyperactivity disorder, combined type: Secondary | ICD-10-CM

## 2024-02-24 DIAGNOSIS — F411 Generalized anxiety disorder: Secondary | ICD-10-CM | POA: Diagnosis not present

## 2024-02-24 DIAGNOSIS — F322 Major depressive disorder, single episode, severe without psychotic features: Secondary | ICD-10-CM | POA: Diagnosis not present

## 2024-02-27 ENCOUNTER — Encounter: Payer: Self-pay | Admitting: Psychology
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
## 2024-04-13 NOTE — Progress Notes (Signed)
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 without issue. No notable motor symptoms.  Social Comportment: Appropriate for the setting.  Mood: Neutral. Affect: Limited range. Largely congruent with mood.  Thought Process/Content: No indications of thought disorder.  Ability to Participate in Interview: Readily answered all questions posed during the clinical interview. There was some notable tangentiality/rapid shifting of topics, but information was all pertinent to the purpose of the evaluation.  Insight: Good.   SUMMARY AND RECOMMEDNATIONS The patient was referred for neuropsychological evaluation by her primary care providers for cognitive and psychiatric evaluation within the context of complex medical history to facilitate differential diagnosis and treatment planning. The patient was referred by Gaetana Macario Haddock, NP, who is with [facility], internal medicine. Records indicate the patient was seen on 01/13/24 by the referring provider. Records from the referring provider reported medical history of stage 4 Endometriosis and polycystic ovary syndrome (PCOS), for which she underwent  surgery in May of 2025. Significant and severe pain were reported related to the condition. Difficulties with hair loss (improved since resuming birth control), mood swings, fatigue, and possible anhedonia were reported, which the patient associated with her PCOS. Notes indicated she began therapy and was considering psychological evaluation for mood disorder. Active problem list from the 01/13/24 visit note indicated migraine, hemochromatosis, IBS, enteritis, and hyperlipidemia, and additional conditions are listed in the pertinent section below. PHQ-2 (depression screener) score was 0. GAD-7 (anxiety screener) score was 6 (moderate).   Upon interview, the patient indicated that she was interested in undergoing the evaluation to help provide clarity/differential regarding possible psychiatric diagnosis. She reported questioning whether OCD, ADHD, or a number of other psychiatric disorders could account for her difficulties. She indicated information would be helpful for both treatment planning but also hopefully provide her insight or clarity into challenges she experiences.     The patient meets criteria for ADHD, combined type. Detailed diagnostic clinical interview and follow-up questioning to validate symptom endorsements yielded compelling evidence for the diagnosis. The performance on the CPT-3 showed considerable elevations consistent with ADHD (with comorbid psychiatric conditions). Score profile showed indications of basic difficulties with inattention, impulsivity, and vigilance. There were some milder problems involving sustained attention. Stimulant medication treatment would likely be beneficial. While multiple cognitive difficulties associated with ADHD can benefit from the medication, vigilance related problems tend to be particularly responsive. Ongoing engagement with mental health treatment to address other psychiatric symptoms remains recommended as these are likely to exacerbate underling  cognitive struggles (although resolution of more acute psychiatric symptoms is not expected to resolve ADHD symptoms to sub-clinical levels).   As discussed above, personality disorder diagnosis is deferred due to risk of over-diagnosis given the limited duration and scope of the current evaluation. There are some notable indications / features of OCPD which warrant consideration. Further evaluation to help clarify whether a diagnosis is warranted within the context of ongoing treatment may be beneficial. Providers working with the patient over a period of time will be better able to establish whether based personality disorder criteria are met. Identification of personality disorders can be valuable for  both patient insight but also for optimal treatment provision as for a range of reasons.    Difficulties with anxiety, depressive, symptoms, and trauma related symptoms are notable. I believe a Generalized Anxiety Disorder diagnosis can be made with relative confidence. Difficulties with depression are notable. The relationship with these symptoms to trauma and possible PTSD, or whether these are adjustment based, is unclear. Symptom criteria for major depressive disorder appears to be met, but it is possible an adjustment or even PTSD diagnosis would be more appropriate. Symptoms are certainly at a level warranting treatment, although it is possible a more refined differential will result within individual therapy and an unspecified specifier is utilized in the current evaluation. Clarity is limited   With regards to PTSD, the patient endorses indications of PTSD symptomatology. Conservatively, the diagnosis will not be made due to limitations of the current evaluation with respect to depth of data collected and uncertainty as to whether full diagnostic criteria is met. 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   As discussed during the session, the patient as history of life experiences which help provide context to some of the difficulties she experiences. I do believe she is on the right path with engaging with individual therapy and psychiatry. It was a pleasure meeting with the patient. Please feel free (patient or provider) to reach out to me with any questions or concerns, including any additional details or data not included in the report that may be beneficial.   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, [address] [place], [place] [address] Main: [phone] Fax: [phone]  This report was generated using voice recognition software. While this document has been carefully reviewed, transcription errors may be present. I apologize in advance for any inconvenience. Please contact me if further clarification is needed.
